# Patient Record
Sex: Male | Born: 1980 | Race: Black or African American | Hispanic: No | Marital: Single | Smoking: Former smoker
Health system: Southern US, Community
[De-identification: ages and names within clinical notes are randomized; demographics above are authoritative.]

## PROBLEM LIST (undated history)

## (undated) DIAGNOSIS — I10 Essential (primary) hypertension: Secondary | ICD-10-CM

---
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
## 2002-11-01 ENCOUNTER — Emergency Department (HOSPITAL_COMMUNITY): Admission: EM | Admit: 2002-11-01 | Discharge: 2002-11-02 | Payer: Self-pay | Admitting: Emergency Medicine

## 2004-01-05 ENCOUNTER — Emergency Department (HOSPITAL_COMMUNITY): Admission: EM | Admit: 2004-01-05 | Discharge: 2004-01-05 | Payer: Self-pay | Admitting: Emergency Medicine

## 2005-06-11 ENCOUNTER — Emergency Department (HOSPITAL_COMMUNITY): Admission: EM | Admit: 2005-06-11 | Discharge: 2005-06-12 | Payer: Self-pay | Admitting: Emergency Medicine

## 2005-07-29 ENCOUNTER — Emergency Department (HOSPITAL_COMMUNITY): Admission: EM | Admit: 2005-07-29 | Discharge: 2005-07-29 | Payer: Self-pay | Admitting: Emergency Medicine

## 2007-11-23 ENCOUNTER — Emergency Department (HOSPITAL_COMMUNITY): Admission: EM | Admit: 2007-11-23 | Discharge: 2007-11-23 | Payer: Self-pay | Admitting: Emergency Medicine

## 2010-04-17 ENCOUNTER — Emergency Department (HOSPITAL_COMMUNITY)
Admission: EM | Admit: 2010-04-17 | Discharge: 2010-04-17 | Disposition: A | Discharge status: Home / Self Care | Payer: Self-pay | Attending: Emergency Medicine | Admitting: Emergency Medicine

## 2010-04-17 DIAGNOSIS — R079 Chest pain, unspecified: Secondary | ICD-10-CM | POA: Insufficient documentation

## 2010-04-17 DIAGNOSIS — R071 Chest pain on breathing: Secondary | ICD-10-CM | POA: Insufficient documentation

## 2010-05-30 ENCOUNTER — Emergency Department (HOSPITAL_COMMUNITY): Payer: Self-pay

## 2010-05-30 ENCOUNTER — Emergency Department (HOSPITAL_COMMUNITY)
Admission: EM | Admit: 2010-05-30 | Discharge: 2010-05-30 | Disposition: A | Discharge status: Home / Self Care | Payer: Self-pay | Attending: Emergency Medicine | Admitting: Emergency Medicine

## 2010-05-30 DIAGNOSIS — R0602 Shortness of breath: Secondary | ICD-10-CM | POA: Insufficient documentation

## 2010-05-30 DIAGNOSIS — K122 Cellulitis and abscess of mouth: Secondary | ICD-10-CM | POA: Insufficient documentation

## 2010-05-30 DIAGNOSIS — R0989 Other specified symptoms and signs involving the circulatory and respiratory systems: Secondary | ICD-10-CM | POA: Insufficient documentation

## 2010-05-30 DIAGNOSIS — R0609 Other forms of dyspnea: Secondary | ICD-10-CM | POA: Insufficient documentation

## 2010-08-22 ENCOUNTER — Emergency Department (HOSPITAL_COMMUNITY)
Admission: EM | Admit: 2010-08-22 | Discharge: 2010-08-22 | Disposition: A | Discharge status: Home / Self Care | Payer: Self-pay | Attending: Emergency Medicine | Admitting: Emergency Medicine

## 2010-08-22 DIAGNOSIS — F17200 Nicotine dependence, unspecified, uncomplicated: Secondary | ICD-10-CM | POA: Insufficient documentation

## 2010-08-22 DIAGNOSIS — J029 Acute pharyngitis, unspecified: Secondary | ICD-10-CM | POA: Insufficient documentation

## 2010-08-22 LAB — RAPID STREP SCREEN (MED CTR MEBANE ONLY): Streptococcus, Group A Screen (Direct): NEGATIVE

## 2011-01-15 ENCOUNTER — Emergency Department (HOSPITAL_COMMUNITY): Payer: Self-pay

## 2011-01-15 ENCOUNTER — Emergency Department (HOSPITAL_COMMUNITY)
Admission: EM | Admit: 2011-01-15 | Discharge: 2011-01-16 | Disposition: A | Discharge status: Home / Self Care | Payer: Self-pay | Attending: Emergency Medicine | Admitting: Emergency Medicine

## 2011-01-15 ENCOUNTER — Encounter: Payer: Self-pay | Admitting: *Deleted

## 2011-01-15 ENCOUNTER — Other Ambulatory Visit: Payer: Self-pay

## 2011-01-15 DIAGNOSIS — R0789 Other chest pain: Secondary | ICD-10-CM

## 2011-01-15 DIAGNOSIS — R0602 Shortness of breath: Secondary | ICD-10-CM | POA: Insufficient documentation

## 2011-01-15 DIAGNOSIS — R071 Chest pain on breathing: Secondary | ICD-10-CM | POA: Insufficient documentation

## 2011-01-15 DIAGNOSIS — F17200 Nicotine dependence, unspecified, uncomplicated: Secondary | ICD-10-CM | POA: Insufficient documentation

## 2011-01-15 MED ORDER — IBUPROFEN 800 MG PO TABS
800.0000 mg | ORAL_TABLET | Freq: Once | ORAL | Status: AC
  Administered 2011-01-15: 800 mg via ORAL
  Filled 2011-01-15: qty 1

## 2011-01-15 NOTE — ED Notes (Signed)
Pt reports onset of left sided chest pain starting earlier today, pt reports increased pain with inspiration and palpation

## 2011-01-16 MED ORDER — IBUPROFEN 800 MG PO TABS: 800.0000 mg | ORAL_TABLET | Freq: Three times a day (TID) | ORAL | Status: AC

## 2011-01-16 NOTE — ED Provider Notes (Signed)
History     CSN: [id number] Arrival date & time: 01/15/2011 10:39 PM   First MD Initiated Contact with Patient 01/15/11 2308      Chief Complaint  Patient presents with  . Chest Pain    (Consider location/radiation/quality/duration/timing/severity/associated sxs/prior treatment) Patient is a 30 y.o. male presenting with chest pain. The history is provided by the patient.  Chest Pain The chest pain began 3 - 5 hours ago. Chest pain occurs frequently. The chest pain is unchanged. The pain is associated with breathing (Any type of movement or twisting.). The severity of the pain is moderate. The quality of the pain is described as sharp. The pain does not radiate. Chest pain is worsened by certain positions and deep breathing. Pertinent negatives for primary symptoms include no fever, no syncope, no shortness of breath, no cough, no palpitations, no abdominal pain, no nausea, no vomiting and no altered mental status.  Pertinent negatives for associated symptoms include no diaphoresis, no lower extremity edema, no near-syncope, no numbness, no orthopnea and no weakness. He tried nothing for the symptoms. Risk factors include smoking/tobacco exposure and male gender.  Pertinent negatives for past medical history include no diabetes, no hypertension, no MI and no PE.  His family medical history is significant for heart disease in family.    smoker with stabbing left-sided chest pain that is worse with movement. Patient concerned because both of his parents had heart attacks in their 59s. Patient has no lower extremity swelling or DVT symptoms. He denies any recent illness. Pain is moderate in severity, intermittent. No history of same  History reviewed. No pertinent past medical history.  History reviewed. No pertinent past surgical history.  No family history on file.  History  Substance Use Topics  . Smoking status: Current Everyday Smoker  . Smokeless tobacco: Not on file  . Alcohol  Use: No      Review of Systems  Constitutional: Negative for fever, chills and diaphoresis.  HENT: Negative for neck pain and neck stiffness.   Eyes: Negative for pain.  Respiratory: Negative for cough and shortness of breath.   Cardiovascular: Positive for chest pain. Negative for palpitations, orthopnea, syncope and near-syncope.  Gastrointestinal: Negative for nausea, vomiting and abdominal pain.  Genitourinary: Negative for dysuria.  Musculoskeletal: Negative for back pain.  Skin: Negative for rash.  Neurological: Negative for weakness, numbness and headaches.  Psychiatric/Behavioral: Negative for altered mental status.  All other systems reviewed and are negative.    Allergies  Bee venom and Penicillins  Home Medications  No current outpatient prescriptions on file.  BP 122/76  Pulse 57  Temp(Src) 97.8 F (36.6 C) (Oral)  Resp 20  Ht 6\' 1"  (1.854 m)  Wt 185 lb (83.915 kg)  BMI 24.41 kg/m2  SpO2 100%  Physical Exam  Constitutional: He is oriented to person, place, and time. He appears well-developed and well-nourished.  HENT:  Head: Normocephalic and atraumatic.  Eyes: Conjunctivae and EOM are normal. Pupils are equal, round, and reactive to light.  Neck: Trachea normal. Neck supple. No thyromegaly present.  Cardiovascular: Normal rate, regular rhythm, S1 normal, S2 normal and normal pulses.     No systolic murmur is present   No diastolic murmur is present  Pulses:      Radial pulses are 2+ on the right side, and 2+ on the left side.  Pulmonary/Chest: Effort normal and breath sounds normal. He has no wheezes. He has no rhonchi. He has no rales. He exhibits no tenderness.  Reproducible tenderness left anterior chest wall. No crepitus or rash  Abdominal: Soft. Normal appearance and bowel sounds are normal. There is no tenderness. There is no CVA tenderness and negative Murphy's sign.  Musculoskeletal:       BLE:s Calves nontender, no cords or erythema,  negative Homans sign  Neurological: He is alert and oriented to person, place, and time. He has normal strength. No cranial nerve deficit or sensory deficit. GCS eye subscore is 4. GCS verbal subscore is 5. GCS motor subscore is 6.  Skin: Skin is warm and dry. No rash noted. He is not diaphoretic.  Psychiatric: His speech is normal.       Cooperative and appropriate    ED Course  Procedures (including critical care time)  Labs Reviewed - No data to display Dg Chest 2 View  01/15/2011  *RADIOLOGY REPORT*  Clinical Data: Chest pain and short of breath  CHEST - 2 VIEW  Comparison:  05/30/2010  Findings:  The heart size and mediastinal contours are within normal limits.  Both lungs are clear.  The visualized skeletal structures are unremarkable.  IMPRESSION: No active cardiopulmonary disease.  Original Report Authenticated By: Camelia Phenes, M.D.    Date: 01/16/2011  Rate: 61  Rhythm: normal sinus rhythm  QRS Axis: normal  Intervals: normal  ST/T Wave abnormalities: nonspecific ST changes  Conduction Disutrbances:none  Narrative Interpretation:   Old EKG Reviewed: none available    Diagnosis: Chest wall pain   MDM   Otherwise healthy male with chest pain that is reproducible and pleuritic in nature. No shortness of breath or DVT/PE symptoms otherwise. Chest x-ray obtained and reviewed as above. EKG obtained and reviewed. Patient given ibuprofen for pain. He does have a primary care physician for close followup as needed. He was educated on the dangers of smoking and encouraged to stop.     Sunnie Nielsen, MD 01/16/11 [phone]

## 2011-05-16 ENCOUNTER — Emergency Department (HOSPITAL_COMMUNITY): Payer: Self-pay

## 2011-05-16 ENCOUNTER — Encounter (HOSPITAL_COMMUNITY): Payer: Self-pay | Admitting: *Deleted

## 2011-05-16 ENCOUNTER — Emergency Department (HOSPITAL_COMMUNITY)
Admission: EM | Admit: 2011-05-16 | Discharge: 2011-05-17 | Disposition: A | Discharge status: Home / Self Care | Payer: Self-pay | Attending: Emergency Medicine | Admitting: Emergency Medicine

## 2011-05-16 DIAGNOSIS — R070 Pain in throat: Secondary | ICD-10-CM | POA: Insufficient documentation

## 2011-05-16 DIAGNOSIS — R142 Eructation: Secondary | ICD-10-CM | POA: Insufficient documentation

## 2011-05-16 DIAGNOSIS — R1013 Epigastric pain: Secondary | ICD-10-CM | POA: Insufficient documentation

## 2011-05-16 DIAGNOSIS — R079 Chest pain, unspecified: Secondary | ICD-10-CM | POA: Insufficient documentation

## 2011-05-16 DIAGNOSIS — K219 Gastro-esophageal reflux disease without esophagitis: Secondary | ICD-10-CM | POA: Insufficient documentation

## 2011-05-16 DIAGNOSIS — R141 Gas pain: Secondary | ICD-10-CM | POA: Insufficient documentation

## 2011-05-16 DIAGNOSIS — J3489 Other specified disorders of nose and nasal sinuses: Secondary | ICD-10-CM | POA: Insufficient documentation

## 2011-05-16 MED ORDER — FAMOTIDINE 20 MG PO TABS
20.0000 mg | ORAL_TABLET | Freq: Once | ORAL | Status: AC
  Administered 2011-05-17: 20 mg via ORAL
  Filled 2011-05-16: qty 1

## 2011-05-16 NOTE — ED Provider Notes (Signed)
History    Scribed for Shelda Jakes, MD, the patient was seen in room APA06/APA06. This chart was scribed by Katha Cabal.  Pt was seen at 11:19 PM    CSN: [id number]  Arrival date & time 05/16/11  2202   First MD Initiated Contact with Patient 05/16/11 2311      Chief Complaint  Patient presents with  . Gastrophageal Reflux    (Consider location/radiation/quality/duration/timing/severity/associated sxs/prior treatment) HPI  Ryan Rios is a 31 y.o. male who presents to the Emergency Department complaining of  gastrophageal reflux with  epigastric abdominal pain and lower left chest pain.  Pain is has been constant and is worse in lower left chest.  Pain does not radiate to back.  Pain is described as sharp.  Symptoms are associated with belching and sore throat.  Patient was seen at [facility] for chest pain and was given Motrin.  Patient states he did not take motrin.  Patient took Tums and baking soda.  Symptoms are not associated with nausea, vomiting, diarrhea.  Patient states he wanted to vomit but could not.  Pain is rated 1 or 2 out of 10 currently and 5/10 at worse.  Patient reports some rhinorrhea.   Patient denies rash, swelling in legs, dysuria, fever, congestion, neck pain, cough or history of bleeding.  There is no history of chronic health problems.    PCP Vivia Ewing, MD, MD     History reviewed. No pertinent past medical history.  History reviewed. No pertinent past surgical history.  History reviewed. No pertinent family history.  History  Substance Use Topics  . Smoking status: Former Games developer  . Smokeless tobacco: Not on file  . Alcohol Use: No      Review of Systems  All other systems reviewed and are negative.  Remaining review of systems negative except as noted in the HPI.    Allergies  Bee venom and Penicillins  Home Medications  No current outpatient prescriptions on file.  BP 119/78  Pulse 83  Temp(Src) 97.6 F (36.4  C) (Oral)  Resp 18  Ht 6\' 1"  (1.854 m)  Wt 175 lb (79.379 kg)  BMI 23.09 kg/m2  SpO2 97%  Physical Exam  Nursing note and vitals reviewed. Constitutional: He is oriented to person, place, and time. He appears well-developed and well-nourished.  HENT:  Head: Normocephalic and atraumatic.  Eyes: Conjunctivae and EOM are normal.  Neck: Neck supple.  Cardiovascular: Normal rate, regular rhythm and normal heart sounds.   No murmur heard. Pulmonary/Chest: Effort normal and breath sounds normal. No respiratory distress. He has no wheezes.  Abdominal: Soft. Bowel sounds are normal. There is no tenderness. There is no rebound and no guarding.  Musculoskeletal: Normal range of motion. He exhibits no edema.  Lymphadenopathy:    He has no cervical adenopathy.  Neurological: He is alert and oriented to person, place, and time. No cranial nerve deficit or sensory deficit. Coordination normal.  Skin: Skin is warm and dry. No rash noted.  Psychiatric: He has a normal mood and affect. His behavior is normal.    ED Course  Procedures (including critical care time)   DIAGNOSTIC STUDIES: Oxygen Saturation is 97% on room air, normal by my interpretation.     COORDINATION OF CARE: 11:21 PM Physical exam complete.  Will order CXR and labs.  11:45 PM  Pepcid ordered.      LABS / RADIOLOGY:    Labs Reviewed  COMPREHENSIVE METABOLIC PANEL  LIPASE, BLOOD  CBC   No results found.    Date: 05/17/2011  Rate: 50  Rhythm: sinus bradycardia and sinus arrhythmia  QRS Axis: normal  Intervals: normal  ST/T Wave abnormalities: normal  Conduction Disutrbances:none  Narrative Interpretation:   Old EKG Reviewed: unchanged No change in EKG from 01/15/2011  Results for orders placed during the hospital encounter of 05/16/11  COMPREHENSIVE METABOLIC PANEL      Component Value Range   Sodium 137  135 - 145 (mEq/L)   Potassium 3.8  3.5 - 5.1 (mEq/L)   Chloride 103  96 - 112 (mEq/L)   CO2 28   19 - 32 (mEq/L)   Glucose, Bld 102 (*) 70 - 99 (mg/dL)   BUN 16  6 - 23 (mg/dL)   Creatinine, Ser 4.54  0.50 - 1.35 (mg/dL)   Calcium 9.6  8.4 - 09.8 (mg/dL)   Total Protein 7.0  6.0 - 8.3 (g/dL)   Albumin 3.6  3.5 - 5.2 (g/dL)   AST 35  0 - 37 (U/L)   ALT 62 (*) 0 - 53 (U/L)   Alkaline Phosphatase 75  39 - 117 (U/L)   Total Bilirubin 0.3  0.3 - 1.2 (mg/dL)   GFR calc non Af Amer >90  >90 (mL/min)   GFR calc Af Amer >90  >90 (mL/min)  LIPASE, BLOOD      Component Value Range   Lipase 35  11 - 59 (U/L)  CBC      Component Value Range   WBC 6.0  4.0 - 10.5 (K/uL)   RBC 4.42  4.22 - 5.81 (MIL/uL)   Hemoglobin 13.1  13.0 - 17.0 (g/dL)   HCT 11.9 (*) 14.7 - 52.0 (%)   MCV 85.1  78.0 - 100.0 (fL)   MCH 29.6  26.0 - 34.0 (pg)   MCHC 34.8  30.0 - 36.0 (g/dL)   RDW 82.9  56.2 - 13.0 (%)   Platelets 158  150 - 400 (K/uL)      MDM  Clinically symptoms sound like GERD workup here tonight without a leukocytosis no significant liver function test abnormalities biliary colic presence of gallstones still a possibility but most likely reflux disease. Treat with Pepcid in the emergency room we'll continue Pepcid at home for the next 2 weeks does have primary care followup available and will need to followup in the next few days. Patient is nontoxic in no acute distress.           MEDICATIONS GIVEN IN THE E.D. Scheduled Meds:    . famotidine  20 mg Oral Once   Continuous Infusions:      IMPRESSION: No diagnosis found.   NEW MEDICATIONS: New Prescriptions   No medications on file     I personally performed the services described in this documentation, which was scribed in my presence. The recorded information has been reviewed and considered.        Shelda Jakes, MD 05/17/11 [phone]

## 2011-05-16 NOTE — ED Notes (Signed)
C/o indigestion x 1 week, denies hx, "hard to belch" per pt

## 2011-05-17 ENCOUNTER — Other Ambulatory Visit: Payer: Self-pay

## 2011-05-17 LAB — CBC
HCT: 37.6 % — ABNORMAL LOW (ref 39.0–52.0)
Hemoglobin: 13.1 g/dL (ref 13.0–17.0)
MCH: 29.6 pg (ref 26.0–34.0)
MCHC: 34.8 g/dL (ref 30.0–36.0)
MCV: 85.1 fL (ref 78.0–100.0)
Platelets: 158 10*3/uL (ref 150–400)
RBC: 4.42 MIL/uL (ref 4.22–5.81)
RDW: 12.4 % (ref 11.5–15.5)
WBC: 6 10*3/uL (ref 4.0–10.5)

## 2011-05-17 LAB — COMPREHENSIVE METABOLIC PANEL
ALT: 62 U/L — ABNORMAL HIGH (ref 0–53)
AST: 35 U/L (ref 0–37)
Albumin: 3.6 g/dL (ref 3.5–5.2)
Alkaline Phosphatase: 75 U/L (ref 39–117)
BUN: 16 mg/dL (ref 6–23)
CO2: 28 mEq/L (ref 19–32)
Calcium: 9.6 mg/dL (ref 8.4–10.5)
Chloride: 103 mEq/L (ref 96–112)
Creatinine, Ser: 1.07 mg/dL (ref 0.50–1.35)
GFR calc Af Amer: >90 mL/min (ref >90)
GFR calc non Af Amer: >90 mL/min (ref >90)
Glucose, Bld: 102 mg/dL — ABNORMAL HIGH (ref 70–99)
Potassium: 3.8 mEq/L (ref 3.5–5.1)
Sodium: 137 mEq/L (ref 135–145)
Total Bilirubin: 0.3 mg/dL (ref 0.3–1.2)
Total Protein: 7 g/dL (ref 6.0–8.3)

## 2011-05-17 LAB — LIPASE, BLOOD: Lipase: 35 U/L (ref 11–59)

## 2011-05-17 MED ORDER — FAMOTIDINE 20 MG PO TABS: 20.0000 mg | ORAL_TABLET | Freq: Two times a day (BID) | ORAL | Status: DC

## 2011-05-17 NOTE — ED Notes (Signed)
Pt reports indigestion for 1 week.  Denies vomiting.  No distress noted.

## 2012-03-02 IMAGING — CR DG CHEST 2V
2 series · 2 of 2 positions shown · non-contrast
Comparison: 07/29/2005

CLINICAL DATA: Chest pressure.  Shortness of breath.

CHEST - 1 VIEW

[view not recorded (1 of 2)]
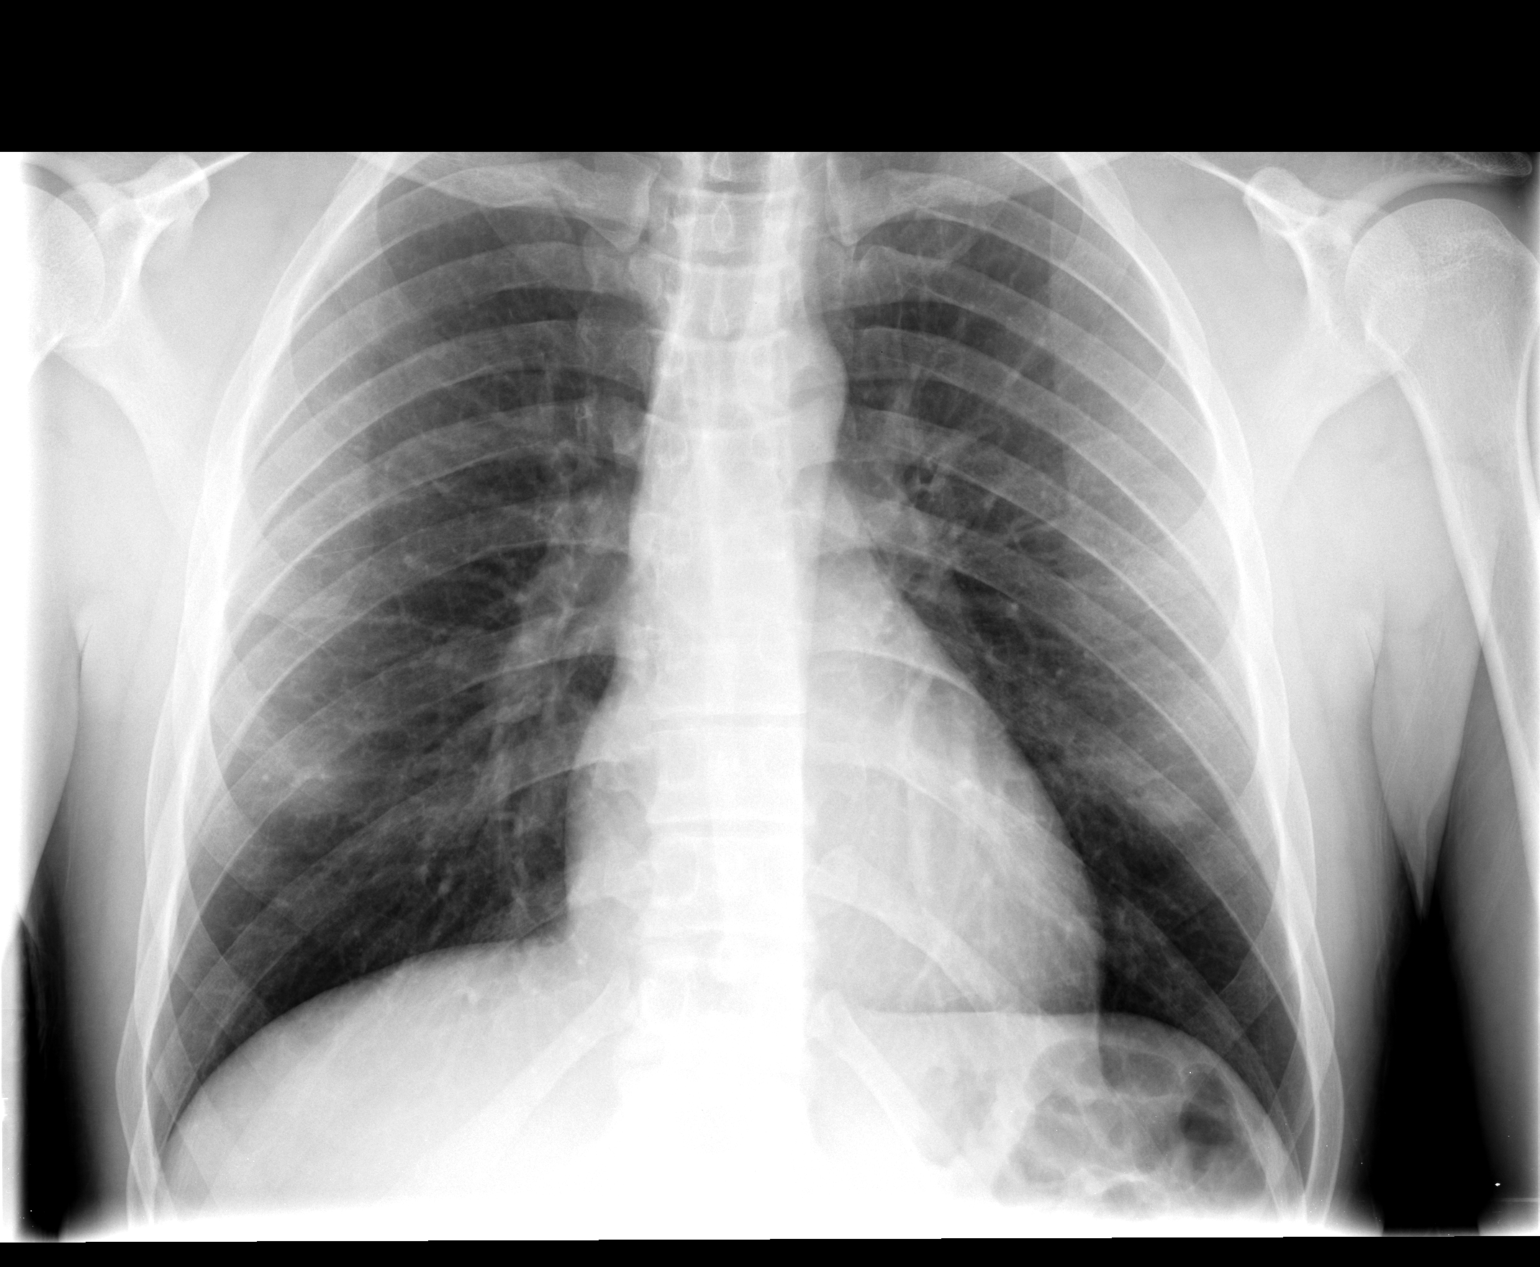

[view not recorded (2 of 2)]
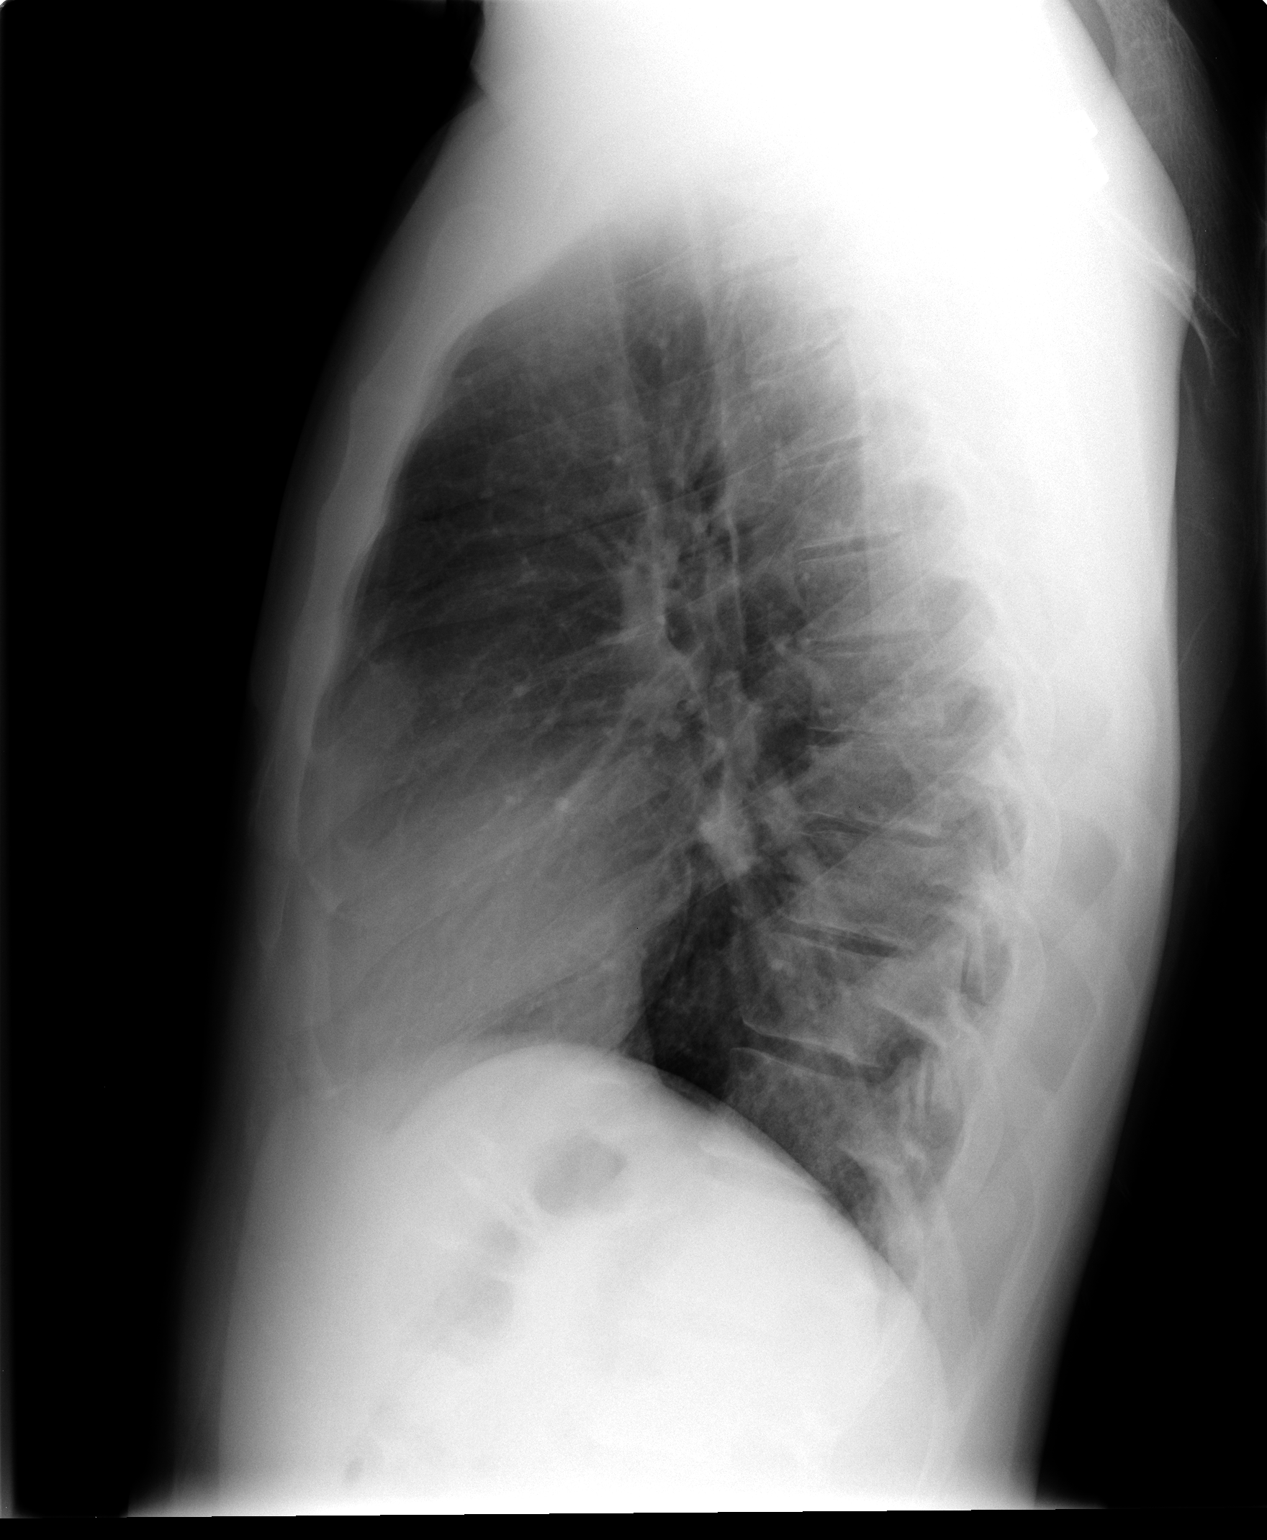

[2 of 2 positions shown; findings below may reference images not displayed]

FINDINGS: The heart size and mediastinal contours are within normal
limits.  Both lungs are clear.
IMPRESSION: No active disease.

## 2012-04-15 ENCOUNTER — Encounter (HOSPITAL_COMMUNITY): Payer: Self-pay | Admitting: *Deleted

## 2012-04-15 ENCOUNTER — Emergency Department (HOSPITAL_COMMUNITY)
Admission: EM | Admit: 2012-04-15 | Discharge: 2012-04-16 | Disposition: A | Discharge status: Home / Self Care | Payer: Self-pay | Attending: Emergency Medicine | Admitting: Emergency Medicine

## 2012-04-15 DIAGNOSIS — Z87891 Personal history of nicotine dependence: Secondary | ICD-10-CM | POA: Insufficient documentation

## 2012-04-15 DIAGNOSIS — R110 Nausea: Secondary | ICD-10-CM | POA: Insufficient documentation

## 2012-04-15 DIAGNOSIS — R51 Headache: Secondary | ICD-10-CM | POA: Insufficient documentation

## 2012-04-15 DIAGNOSIS — G44209 Tension-type headache, unspecified, not intractable: Secondary | ICD-10-CM

## 2012-04-15 NOTE — ED Provider Notes (Signed)
History     CSN: [id number]  Arrival date & time 04/15/12  2246   First MD Initiated Contact with Patient 04/15/12 2310      Chief Complaint  Patient presents with  . Headache    (Consider location/radiation/quality/duration/timing/severity/associated sxs/prior treatment) Patient is a 32 y.o. male presenting with headaches. The history is provided by the patient.  Headache  This is a new problem. The current episode started yesterday. The problem has not changed since onset.The headache is associated with nothing. The pain is located in the left unilateral and temporal region. The quality of the pain is described as dull. The pain is moderate. The pain does not radiate. Associated symptoms include nausea. Pertinent negatives include no anorexia, no fever, no palpitations, no syncope, no shortness of breath and no vomiting. He has tried NSAIDs for the symptoms. The treatment provided no relief.    History reviewed. No pertinent past medical history.  History reviewed. No pertinent past surgical history.  History reviewed. No pertinent family history.  History  Substance Use Topics  . Smoking status: Former Games developer  . Smokeless tobacco: Not on file  . Alcohol Use: No      Review of Systems  Constitutional: Negative for fever and activity change.       All ROS Neg except as noted in HPI  HENT: Negative for nosebleeds and neck pain.   Eyes: Negative for photophobia and discharge.  Respiratory: Negative for cough, shortness of breath and wheezing.   Cardiovascular: Negative for chest pain, palpitations and syncope.  Gastrointestinal: Positive for nausea. Negative for vomiting, abdominal pain, blood in stool and anorexia.  Genitourinary: Negative for dysuria, frequency and hematuria.  Musculoskeletal: Negative for back pain and arthralgias.  Skin: Negative.   Neurological: Positive for headaches. Negative for dizziness, seizures and speech difficulty.  Psychiatric/Behavioral:  Negative for hallucinations and confusion.    Allergies  Bee venom and Penicillins  Home Medications   Current Outpatient Rx  Name  Route  Sig  Dispense  Refill  . FAMOTIDINE 20 MG PO TABS   Oral   Take 1 tablet (20 mg total) by mouth 2 (two) times daily.   30 tablet   0     BP 136/92  Pulse 77  Temp 98.4 F (36.9 C) (Oral)  Resp 20  Ht 6\' 1"  (1.854 m)  Wt 200 lb (90.719 kg)  BMI 26.39 kg/m2  SpO2 100%  Physical Exam  Nursing note and vitals reviewed. Constitutional: He is oriented to person, place, and time. He appears well-developed and well-nourished.  Non-toxic appearance.  HENT:  Head: Normocephalic.  Right Ear: Tympanic membrane and external ear normal.  Left Ear: Tympanic membrane and external ear normal.  Eyes: EOM and lids are normal. Pupils are equal, round, and reactive to light.  Neck: Normal range of motion. Neck supple. Carotid bruit is not present.       Muscles to the left side of the neck and shoulders tight and tense.   Cardiovascular: Normal rate, regular rhythm, normal heart sounds, intact distal pulses and normal pulses.   Pulmonary/Chest: Breath sounds normal. No respiratory distress.  Abdominal: Soft. Bowel sounds are normal. There is no tenderness. There is no guarding.  Musculoskeletal: Normal range of motion.       Muscle tightness in the left upper trapezius.  Lymphadenopathy:       Head (right side): No submandibular adenopathy present.       Head (left side): No submandibular adenopathy present.  He has no cervical adenopathy.  Neurological: He is alert and oriented to person, place, and time. He has normal strength. No cranial nerve deficit or sensory deficit. He exhibits normal muscle tone. Coordination normal.  Skin: Skin is warm and dry.  Psychiatric: He has a normal mood and affect. His speech is normal.    ED Course  Procedures (including critical care time)  Labs Reviewed - No data to display No results found. PUlse Ox  100% on RA.WNL by my interpretation.  No diagnosis found.    MDM  I have reviewed nursing notes, vital signs, and all appropriate lab and imaging results for this patient. Pt presents to ED with headache that is described as a pinching sensation at the top of the left scalp, and a tightness of the neck and shoulder and temporal area on the left. No history of injury or trauma. Exam is negative for acute changes or problem. Plan - Rx for robaxin and norco for muscle contraction headache. Pt to return if not improving. No thunder clap headache, or "worse headache of his life". No visual changes. No LOC. No change in gait, speech, or facial symmetry. Feel it is safe for pt to be discharged home.       Kathie Dike, [place] 04/16/12 [phone]

## 2012-04-15 NOTE — ED Notes (Signed)
Onset yesterday with "pinching sensation to top of my head",  Cold , numb sensation to back of head.  No known injury. Alert,

## 2012-04-16 MED ORDER — METHOCARBAMOL 500 MG PO TABS
1000.0000 mg | ORAL_TABLET | Freq: Once | ORAL | Status: AC
  Administered 2012-04-16: 1000 mg via ORAL
  Filled 2012-04-16: qty 2

## 2012-04-16 MED ORDER — HYDROCODONE-ACETAMINOPHEN 5-325 MG PO TABS: ORAL_TABLET | ORAL | Status: DC

## 2012-04-16 MED ORDER — PROMETHAZINE HCL 12.5 MG PO TABS
12.5000 mg | ORAL_TABLET | Freq: Once | ORAL | Status: AC
  Administered 2012-04-16: 12.5 mg via ORAL
  Filled 2012-04-16: qty 1

## 2012-04-16 MED ORDER — METHOCARBAMOL 500 MG PO TABS: 500.0000 mg | ORAL_TABLET | Freq: Three times a day (TID) | ORAL | Status: DC

## 2012-04-16 MED ORDER — HYDROCODONE-ACETAMINOPHEN 5-325 MG PO TABS
2.0000 | ORAL_TABLET | Freq: Once | ORAL | Status: AC
  Administered 2012-04-16: 2 via ORAL
  Filled 2012-04-16: qty 2

## 2012-04-16 NOTE — ED Provider Notes (Signed)
Medical screening examination/treatment/procedure(s) were conducted as a shared visit with non-physician practitioner(s) and myself.  I personally evaluated the patient during the encounter  Nicoletta Dress. Colon Branch, MD 04/16/12 [phone]

## 2012-06-04 ENCOUNTER — Other Ambulatory Visit: Payer: Self-pay

## 2012-06-04 ENCOUNTER — Encounter (HOSPITAL_COMMUNITY): Payer: Self-pay | Admitting: *Deleted

## 2012-06-04 ENCOUNTER — Emergency Department (HOSPITAL_COMMUNITY)
Admission: EM | Admit: 2012-06-04 | Discharge: 2012-06-05 | Disposition: A | Discharge status: Home / Self Care | Payer: Self-pay | Attending: Emergency Medicine | Admitting: Emergency Medicine

## 2012-06-04 DIAGNOSIS — Z87891 Personal history of nicotine dependence: Secondary | ICD-10-CM | POA: Insufficient documentation

## 2012-06-04 DIAGNOSIS — I10 Essential (primary) hypertension: Secondary | ICD-10-CM | POA: Insufficient documentation

## 2012-06-04 DIAGNOSIS — R002 Palpitations: Secondary | ICD-10-CM | POA: Insufficient documentation

## 2012-06-04 DIAGNOSIS — R0789 Other chest pain: Secondary | ICD-10-CM | POA: Insufficient documentation

## 2012-06-04 LAB — POCT I-STAT, CHEM 8
BUN: 17 mg/dL (ref 6–23)
Calcium, Ion: 1.2 mmol/L (ref 1.12–1.23)
Chloride: 103 mEq/L (ref 96–112)
Creatinine, Ser: 1.2 mg/dL (ref 0.50–1.35)
Glucose, Bld: 106 mg/dL — ABNORMAL HIGH (ref 70–99)
HCT: 41 % (ref 39.0–52.0)
Hemoglobin: 13.9 g/dL (ref 13.0–17.0)
Potassium: 4.1 mEq/L (ref 3.5–5.1)
Sodium: 138 mEq/L (ref 135–145)
TCO2: 27 mmol/L (ref 0–100)

## 2012-06-04 MED ORDER — ATENOLOL 25 MG PO TABS: 25.0000 mg | ORAL_TABLET | Freq: Every day | ORAL | Status: DC

## 2012-06-04 NOTE — ED Notes (Addendum)
Pt states he "feels weird" like his heart is fluttering and he is drowsy. Pt has been worrying about his blood pressure lately. Pt also c/o aches and pains in his left leg, left arm and the top of his head.

## 2012-06-04 NOTE — ED Provider Notes (Signed)
History    This chart was scribed for Ryan Nielsen, MD, by Frederik Pear, ED scribe. The patient was seen in room APA10/APA10 and the patient's care was started at 2302.    CSN: [id number]  Arrival date & time 06/04/12  2245   First MD Initiated Contact with Patient 06/04/12 2302      Chief Complaint  Patient presents with  . Tachycardia    (Consider location/radiation/quality/duration/timing/severity/associated sxs/prior treatment) Patient is a 32 y.o. male presenting with general illness. The history is provided by the patient. No language interpreter was used.  Illness  The current episode started today. The onset was sudden. The problem occurs continuously. The problem has been rapidly improving. The problem is mild. Nothing relieves the symptoms. Nothing aggravates the symptoms.   PHYLLIP CLAW is a 32 y.o. male who presents to the Emergency Department complaining of sudden onset, rapidly improving hypertension that was first noticed when he took his BP with a cuff at [facility], which read 165/95. He also complains of an intermittent, fluttering sensation in his chest that lasts for approximately an hour before it resolves on its own and occurs 2-3 times a day. He also complains of a pinching sensation to his scalp and extremities that comes and goes. He reports that he had a viral infection 2 weeks ago that has since resolved. He denies any h/o of thyroid disorders. He reports that he takes prevacid daily, but denies any other h/o of chronic medical conditions. He is allergic to penicillin.   No PCP.  History reviewed. No pertinent past medical history.  History reviewed. No pertinent past surgical history.  History reviewed. No pertinent family history.  History  Substance Use Topics  . Smoking status: Former Games developer  . Smokeless tobacco: Not on file  . Alcohol Use: No      Review of Systems  Respiratory: Positive for chest tightness (fluttering).   All other systems  reviewed and are negative.   Allergies  Bee venom and Penicillins  Home Medications   Current Outpatient Rx  Name  Route  Sig  Dispense  Refill  . EXPIRED: famotidine (PEPCID) 20 MG tablet   Oral   Take 1 tablet (20 mg total) by mouth 2 (two) times daily.   30 tablet   0   . HYDROcodone-acetaminophen (NORCO/VICODIN) 5-325 MG per tablet      1 or 2 po q4h prn pain   16 tablet   0   . methocarbamol (ROBAXIN) 500 MG tablet   Oral   Take 1 tablet (500 mg total) by mouth 3 (three) times daily.   21 tablet   0     BP 147/94  Pulse 66  Temp(Src) 97.9 F (36.6 C) (Oral)  Resp 24  Ht 6' (1.829 m)  Wt 181 lb (82.101 kg)  BMI 24.54 kg/m2  SpO2 100%  Physical Exam  Nursing note and vitals reviewed. Constitutional: He is oriented to person, place, and time. He appears well-developed and well-nourished. No distress.  HENT:  Head: Normocephalic and atraumatic.  Eyes: EOM are normal.  Neck: Normal range of motion. Neck supple. No tracheal deviation present.  Cardiovascular: Normal rate, regular rhythm and normal heart sounds.   No murmur heard. Negative homans sign.  Pulmonary/Chest: Effort normal and breath sounds normal. No respiratory distress.  Abdominal: There is no tenderness.  Musculoskeletal: Normal range of motion. He exhibits no edema and no tenderness.  No peripheral edema. No evidence of cords.  Neurological: He is  alert and oriented to person, place, and time.  Skin: Skin is warm and dry.  Psychiatric: He has a normal mood and affect. His behavior is normal.    ED Course  Procedures (including critical care time)  DIAGNOSTIC STUDIES: Oxygen Saturation is 100% on room air, normal by my interpretation.    COORDINATION OF CARE:  23:19- Discussed planned course of treatment with the patient, including an EKG and I-Stat, who is agreeable at this time.  Results for orders placed during the hospital encounter of 06/04/12  POCT I-STAT, CHEM 8      Result  Value Range   Sodium 138  135 - 145 mEq/L   Potassium 4.1  3.5 - 5.1 mEq/L   Chloride 103  96 - 112 mEq/L   BUN 17  6 - 23 mg/dL   Creatinine, Ser 8.29  0.50 - 1.35 mg/dL   Glucose, Bld 562 (*) 70 - 99 mg/dL   Calcium, Ion 1.30  1.12 - 1.23 mmol/L   TCO2 27  0 - 100 mmol/L   Hemoglobin 13.9  13.0 - 17.0 g/dL   HCT 86.5  78.4 - 69.6 %    Date: 06/04/2012  Rate: 64  Rhythm: normal sinus rhythm  QRS Axis: normal  Intervals: normal  ST/T Wave abnormalities: nonspecific ST changes  Conduction Disutrbances:none  Narrative Interpretation:   Old EKG Reviewed: unchanged   MDM  Elevated BP at [facility] now borderline elevation. Pt also with fluttering intermittently for weeks. Plan PCP follow up recheck BP and consider Holter monitoring. No indication for admit of further work up at this time in the ED - suspect element of anxiety.   Labs, screening ECG.  VS and nursing notes reviewed. PT agrees to return precautions.   I personally performed the services described in this documentation, which was scribed in my presence. The recorded information has been reviewed and is accurate.        Ryan Nielsen, MD 06/04/12 [phone]

## 2012-06-05 MED ORDER — ATENOLOL 25 MG PO TABS: 12.5000 mg | ORAL_TABLET | Freq: Every day | ORAL | Status: DC

## 2012-07-22 ENCOUNTER — Encounter (HOSPITAL_COMMUNITY): Payer: Self-pay | Admitting: *Deleted

## 2012-07-22 ENCOUNTER — Emergency Department (HOSPITAL_COMMUNITY)
Admission: EM | Admit: 2012-07-22 | Discharge: 2012-07-22 | Disposition: A | Discharge status: Home / Self Care | Payer: Self-pay | Attending: Emergency Medicine | Admitting: Emergency Medicine

## 2012-07-22 DIAGNOSIS — Y9389 Activity, other specified: Secondary | ICD-10-CM | POA: Insufficient documentation

## 2012-07-22 DIAGNOSIS — X500XXA Overexertion from strenuous movement or load, initial encounter: Secondary | ICD-10-CM | POA: Insufficient documentation

## 2012-07-22 DIAGNOSIS — S199XXA Unspecified injury of neck, initial encounter: Secondary | ICD-10-CM | POA: Insufficient documentation

## 2012-07-22 DIAGNOSIS — E119 Type 2 diabetes mellitus without complications: Secondary | ICD-10-CM | POA: Insufficient documentation

## 2012-07-22 DIAGNOSIS — S43499A Other sprain of unspecified shoulder joint, initial encounter: Secondary | ICD-10-CM | POA: Insufficient documentation

## 2012-07-22 DIAGNOSIS — Z79899 Other long term (current) drug therapy: Secondary | ICD-10-CM | POA: Insufficient documentation

## 2012-07-22 DIAGNOSIS — Z880 Allergy status to penicillin: Secondary | ICD-10-CM | POA: Insufficient documentation

## 2012-07-22 DIAGNOSIS — I10 Essential (primary) hypertension: Secondary | ICD-10-CM | POA: Insufficient documentation

## 2012-07-22 DIAGNOSIS — Y929 Unspecified place or not applicable: Secondary | ICD-10-CM | POA: Insufficient documentation

## 2012-07-22 DIAGNOSIS — S0993XA Unspecified injury of face, initial encounter: Secondary | ICD-10-CM | POA: Insufficient documentation

## 2012-07-22 DIAGNOSIS — Z87891 Personal history of nicotine dependence: Secondary | ICD-10-CM | POA: Insufficient documentation

## 2012-07-22 DIAGNOSIS — S46812A Strain of other muscles, fascia and tendons at shoulder and upper arm level, left arm, initial encounter: Secondary | ICD-10-CM

## 2012-07-22 HISTORY — DX: Essential (primary) hypertension: I10

## 2012-07-22 MED ORDER — HYDROCODONE-ACETAMINOPHEN 5-325 MG PO TABS: 1.0000 | ORAL_TABLET | ORAL | Status: DC | PRN

## 2012-07-22 MED ORDER — KETOROLAC TROMETHAMINE 60 MG/2ML IM SOLN
60.0000 mg | Freq: Once | INTRAMUSCULAR | Status: AC
  Administered 2012-07-22: 60 mg via INTRAMUSCULAR
  Filled 2012-07-22: qty 2

## 2012-07-22 MED ORDER — HYDROCODONE-ACETAMINOPHEN 5-325 MG PO TABS
1.0000 | ORAL_TABLET | Freq: Once | ORAL | Status: AC
  Administered 2012-07-22: 1 via ORAL
  Filled 2012-07-22: qty 1

## 2012-07-22 MED ORDER — MELOXICAM 7.5 MG PO TABS: ORAL_TABLET | ORAL | Status: DC

## 2012-07-22 MED ORDER — BACLOFEN 10 MG PO TABS: 10.0000 mg | ORAL_TABLET | Freq: Three times a day (TID) | ORAL | Status: AC

## 2012-07-22 MED ORDER — DIAZEPAM 5 MG PO TABS
5.0000 mg | ORAL_TABLET | Freq: Once | ORAL | Status: AC
  Administered 2012-07-22: 5 mg via ORAL
  Filled 2012-07-22: qty 1

## 2012-07-22 NOTE — ED Notes (Signed)
Pt complains of left neck and left shoulder pain x3 days, states on Tuesday he went to lift up some weights and felt a pop in his L shoulder, states pain shoots from back to front of chest. Hurts worse when lying down. No other complaints.

## 2012-07-22 NOTE — ED Provider Notes (Signed)
History     CSN: [id number]  Arrival date & time 07/22/12  2136   First MD Initiated Contact with Patient 07/22/12 2209      Chief Complaint  Patient presents with  . Neck Pain    (Consider location/radiation/quality/duration/timing/severity/associated sxs/prior treatment) Patient is a 32 y.o. male presenting with neck pain. The history is provided by the patient.  Neck Pain Pain location:  L side Quality:  Aching and cramping Pain radiates to:  L shoulder Pain severity:  Moderate Pain is:  Same all the time Onset quality:  Sudden Timing:  Constant Progression:  Worsening Chronicity:  New Context: lifting a heavy object   Relieved by:  Nothing Exacerbated by: movement of the left shoulder. Ineffective treatments:  None tried Associated symptoms: no bladder incontinence, no bowel incontinence, no chest pain, no numbness and no photophobia   Risk factors: no hx of spinal trauma, no recent head injury and no recurrent falls     Past Medical History  Diagnosis Date  . Diabetes mellitus without complication   . Hypertension     History reviewed. No pertinent past surgical history.  History reviewed. No pertinent family history.  History  Substance Use Topics  . Smoking status: Former Games developer  . Smokeless tobacco: Not on file  . Alcohol Use: No      Review of Systems  Constitutional: Negative for activity change.       All ROS Neg except as noted in HPI  HENT: Positive for neck pain. Negative for nosebleeds.   Eyes: Negative for photophobia and discharge.  Respiratory: Negative for cough, shortness of breath and wheezing.   Cardiovascular: Negative for chest pain and palpitations.  Gastrointestinal: Negative for abdominal pain, blood in stool and bowel incontinence.  Genitourinary: Negative for bladder incontinence, dysuria, frequency and hematuria.  Musculoskeletal: Negative for back pain and arthralgias.  Skin: Negative.   Neurological: Negative for  dizziness, seizures, speech difficulty and numbness.  Psychiatric/Behavioral: Negative for hallucinations and confusion.    Allergies  Bee venom and Penicillins  Home Medications   Current Outpatient Rx  Name  Route  Sig  Dispense  Refill  . atenolol (TENORMIN) 25 MG tablet   Oral   Take 0.5 tablets (12.5 mg total) by mouth daily.   14 tablet   0   . baclofen (LIORESAL) 10 MG tablet   Oral   Take 1 tablet (10 mg total) by mouth 3 (three) times daily.   21 each   0   . HYDROcodone-acetaminophen (NORCO/VICODIN) 5-325 MG per tablet   Oral   Take 1 tablet by mouth every 4 (four) hours as needed for pain.   20 tablet   0   . meloxicam (MOBIC) 7.5 MG tablet      1 po bid with food   14 tablet   0     BP 121/85  Pulse 57  Temp(Src) 97 F (36.1 C) (Oral)  Resp 20  Ht 6\' 1"  (1.854 m)  Wt 180 lb (81.647 kg)  BMI 23.75 kg/m2  SpO2 100%  Physical Exam  Nursing note and vitals reviewed. Constitutional: He is oriented to person, place, and time. He appears well-developed and well-nourished.  Non-toxic appearance.  HENT:  Head: Normocephalic.  Right Ear: Tympanic membrane and external ear normal.  Left Ear: Tympanic membrane and external ear normal.  Eyes: EOM and lids are normal. Pupils are equal, round, and reactive to light.  Neck: Normal range of motion. Neck supple. Carotid bruit is  not present.  Cardiovascular: Normal rate, regular rhythm, normal heart sounds, intact distal pulses and normal pulses.   Pulmonary/Chest: Breath sounds normal. No respiratory distress.  Abdominal: Soft. Bowel sounds are normal. There is no tenderness. There is no guarding.  Musculoskeletal: Normal range of motion.  There is pain of the left lower neck extending into the left shoulder. The soreness is in the distribution of the trapezius muscle on the left. There is full range of motion of the left shoulder, but with some discomfort. There is full range of motion of the left elbow wrist  and fingers.  Lymphadenopathy:       Head (right side): No submandibular adenopathy present.       Head (left side): No submandibular adenopathy present.    He has no cervical adenopathy.  Neurological: He is alert and oriented to person, place, and time. He has normal strength. No cranial nerve deficit or sensory deficit.  Skin: Skin is warm and dry.  Psychiatric: He has a normal mood and affect. His speech is normal.    ED Course  Procedures (including critical care time)  Labs Reviewed - No data to display No results found.   1. Trapezius muscle strain, left, initial encounter       MDM  I have reviewed nursing notes, vital signs, and all appropriate lab and imaging results for this patient. Patient states he was lifting weights when he felt something pull in the back of his neck extending into the shoulder on the left side. He's been having pain since that time. Examination is negative for any neurovascular deficits. Suspect patient has a trapezius strain. Patient is treated with baclofen, Mobic, and Norco. Patient is to see the orthopedic physician for additional evaluation if not improving.       Kathie Dike, PA-C 07/22/12 2304

## 2012-07-22 NOTE — ED Notes (Signed)
Pt was lifting heavy wt, and  Had a sudden  Onset of pain in neck and lt shoulder.

## 2012-07-25 NOTE — ED Provider Notes (Signed)
Medical screening examination/treatment/procedure(s) were performed by non-physician practitioner and as supervising physician I was immediately available for consultation/collaboration.   Montey Ebel W. Shaylon Gillean, MD 07/25/12 0510 

## 2012-08-21 ENCOUNTER — Emergency Department (HOSPITAL_COMMUNITY)
Admission: EM | Admit: 2012-08-21 | Discharge: 2012-08-21 | Disposition: A | Discharge status: Home / Self Care | Payer: Self-pay | Attending: Emergency Medicine | Admitting: Emergency Medicine

## 2012-08-21 ENCOUNTER — Encounter (HOSPITAL_COMMUNITY): Payer: Self-pay | Admitting: *Deleted

## 2012-08-21 DIAGNOSIS — Z79899 Other long term (current) drug therapy: Secondary | ICD-10-CM | POA: Insufficient documentation

## 2012-08-21 DIAGNOSIS — I10 Essential (primary) hypertension: Secondary | ICD-10-CM | POA: Insufficient documentation

## 2012-08-21 DIAGNOSIS — M542 Cervicalgia: Secondary | ICD-10-CM

## 2012-08-21 DIAGNOSIS — S46909A Unspecified injury of unspecified muscle, fascia and tendon at shoulder and upper arm level, unspecified arm, initial encounter: Secondary | ICD-10-CM | POA: Insufficient documentation

## 2012-08-21 DIAGNOSIS — X503XXA Overexertion from repetitive movements, initial encounter: Secondary | ICD-10-CM | POA: Insufficient documentation

## 2012-08-21 DIAGNOSIS — Z880 Allergy status to penicillin: Secondary | ICD-10-CM | POA: Insufficient documentation

## 2012-08-21 DIAGNOSIS — S0993XA Unspecified injury of face, initial encounter: Secondary | ICD-10-CM | POA: Insufficient documentation

## 2012-08-21 DIAGNOSIS — S4980XA Other specified injuries of shoulder and upper arm, unspecified arm, initial encounter: Secondary | ICD-10-CM | POA: Insufficient documentation

## 2012-08-21 DIAGNOSIS — Y929 Unspecified place or not applicable: Secondary | ICD-10-CM | POA: Insufficient documentation

## 2012-08-21 DIAGNOSIS — Z87891 Personal history of nicotine dependence: Secondary | ICD-10-CM | POA: Insufficient documentation

## 2012-08-21 DIAGNOSIS — Y939 Activity, unspecified: Secondary | ICD-10-CM | POA: Insufficient documentation

## 2012-08-21 MED ORDER — IBUPROFEN 800 MG PO TABS
800.0000 mg | ORAL_TABLET | Freq: Once | ORAL | Status: AC
  Administered 2012-08-21: 800 mg via ORAL
  Filled 2012-08-21: qty 1

## 2012-08-21 NOTE — ED Notes (Signed)
States he had similar situation a month or so ago.  Tonight pain woke him from sleep, much more severe

## 2012-08-21 NOTE — ED Provider Notes (Signed)
History     CSN: [id number]  Arrival date & time 08/21/12  4098   First MD Initiated Contact with Patient 08/21/12 [phone]      Chief Complaint  Patient presents with  . Neck Pain  . Arm Pain    Patient is a 32 y.o. male presenting with neck pain. The history is provided by the patient.  Neck Pain Pain location:  L side Quality:  Aching Pain radiates to:  L arm Pain severity:  Moderate Onset quality:  Gradual Duration:  1 day Timing:  Constant Progression:  Worsening Chronicity:  Recurrent Context comment:  Heavy lifting Worsened by:  Position Associated symptoms: no fever, no headaches, no numbness, no paresis and no weakness   pt reports onset of neck pain yesterday with worsening overnight No trauma/falls  He reports similar pain last month after heavy lifting.   No h/o neck surgery No focal weakness but he reports pain in his left arm No HA No visual changes  Past Medical History  Diagnosis Date  . Hypertension     History reviewed. No pertinent past surgical history.  History reviewed. No pertinent family history.  History  Substance Use Topics  . Smoking status: Former Games developer  . Smokeless tobacco: Not on file  . Alcohol Use: No      Review of Systems  Constitutional: Negative for fever.  HENT: Positive for neck pain. Negative for neck stiffness.   Musculoskeletal: Negative for back pain.  Neurological: Negative for weakness, numbness and headaches.    Allergies  Bee venom and Penicillins  Home Medications   Current Outpatient Rx  Name  Route  Sig  Dispense  Refill  . atenolol (TENORMIN) 25 MG tablet   Oral   Take 0.5 tablets (12.5 mg total) by mouth daily.   14 tablet   0   . baclofen (LIORESAL) 10 MG tablet   Oral   Take 1 tablet (10 mg total) by mouth 3 (three) times daily.   21 each   0   . HYDROcodone-acetaminophen (NORCO/VICODIN) 5-325 MG per tablet   Oral   Take 1 tablet by mouth every 4 (four) hours as needed for pain.   20  tablet   0   . meloxicam (MOBIC) 7.5 MG tablet      1 po bid with food   14 tablet   0     BP 128/76  Pulse 59  Temp(Src) 98 F (36.7 C) (Oral)  Ht 6\' 1"  (1.854 m)  Wt 184 lb (83.462 kg)  BMI 24.28 kg/m2  SpO2 100%  Physical Exam CONSTITUTIONAL: Well developed/well nourished HEAD: Normocephalic/atraumatic EYES: EOMI/PERRL ENMT: Mucous membranes moist, no trismus NECK: supple no meningeal signs, no anterior neck swelling SPINE:entire spine nontender Cervical paraspinal tenderness CV: S1/S2 noted, no murmurs/rubs/gallops noted LUNGS: Lungs are clear to auscultation bilaterally, no apparent distress ABDOMEN: soft, nontender, no rebound or guarding GU:no cva tenderness NEURO: Pt is awake/alert, moves all extremitiesx4 Equal power with hand grip, wrist flex/extension, elbow flex/extension, and equal power with shoulder abduction/adduction.  No focal sensory deficit is noted in either UE.   Equal biceps/brachioradial reflex in bilateral UE EXTREMITIES: pulses normal, full ROM Tenderness to left trapezius noted No warmth/erythema noted to neck or trapezius SKIN: warm, color normal PSYCH: no abnormalities of mood noted  ED Course  Procedures   1. Neck pain     Pt endorses similar episode last month but this time had pain into his arm.  He may have cervical  radiculopathy but currently without focal weakness He is in no distress, no focal neuro deficits I offered xray imaging but he deferred and will f/u He has all of his meds from previous visit and does not need any new meds    MDM  Nursing notes including past medical history and social history reviewed and considered in documentation Previous records reviewed and considered         Joya Gaskins, MD 08/21/12 [phone]

## 2012-08-21 NOTE — ED Notes (Addendum)
Pt reporting pain in left side of neck, starting yesterday.  States that he took a nap and and when he woke up, the left arm was also hurting.  Pt denies taking any OTC medications.

## 2012-12-27 ENCOUNTER — Encounter (HOSPITAL_COMMUNITY): Payer: Self-pay | Admitting: Emergency Medicine

## 2012-12-27 ENCOUNTER — Emergency Department (HOSPITAL_COMMUNITY)
Admission: EM | Admit: 2012-12-27 | Discharge: 2012-12-27 | Disposition: A | Discharge status: Home / Self Care | Payer: BC Managed Care – PPO | Attending: Emergency Medicine | Admitting: Emergency Medicine

## 2012-12-27 DIAGNOSIS — M549 Dorsalgia, unspecified: Secondary | ICD-10-CM | POA: Insufficient documentation

## 2012-12-27 DIAGNOSIS — Z87891 Personal history of nicotine dependence: Secondary | ICD-10-CM | POA: Insufficient documentation

## 2012-12-27 DIAGNOSIS — R1032 Left lower quadrant pain: Secondary | ICD-10-CM | POA: Insufficient documentation

## 2012-12-27 DIAGNOSIS — R109 Unspecified abdominal pain: Secondary | ICD-10-CM

## 2012-12-27 DIAGNOSIS — Z880 Allergy status to penicillin: Secondary | ICD-10-CM | POA: Insufficient documentation

## 2012-12-27 DIAGNOSIS — I10 Essential (primary) hypertension: Secondary | ICD-10-CM | POA: Insufficient documentation

## 2012-12-27 DIAGNOSIS — Z79899 Other long term (current) drug therapy: Secondary | ICD-10-CM | POA: Insufficient documentation

## 2012-12-27 DIAGNOSIS — I498 Other specified cardiac arrhythmias: Secondary | ICD-10-CM | POA: Insufficient documentation

## 2012-12-27 LAB — URINALYSIS, ROUTINE W REFLEX MICROSCOPIC
Bilirubin Urine: NEGATIVE
Ketones, ur: NEGATIVE mg/dL
Nitrite: NEGATIVE
Urobilinogen, UA: 0.2 mg/dL (ref 0.0–1.0)

## 2012-12-27 MED ORDER — POLYETHYLENE GLYCOL 3350 17 G PO PACK: 17.0000 g | PACK | Freq: Every day | ORAL | Status: DC

## 2012-12-27 NOTE — ED Notes (Signed)
Pt presents to department for evaluation of L sided abdominal pain radiating to back. Ongoing x1 month. 6/10, describes as constant pain. Also states cloudy urine. No nausea/vomiting. Denies fever. He is alert and oriented x4.

## 2012-12-27 NOTE — ED Notes (Signed)
Pt comfortable with d/c and f/u instructions. Prescriptions x1 

## 2012-12-27 NOTE — ED Provider Notes (Signed)
CSN: [id number]     Arrival date & time 12/27/12  1825 History   First MD Initiated Contact with Patient 12/27/12 2036     Chief Complaint  Patient presents with  . Abdominal Pain  . Back Pain   (Consider location/radiation/quality/duration/timing/severity/associated sxs/prior Treatment) Patient is a 32 y.o. male presenting with abdominal pain and back pain.  Abdominal Pain Back Pain Associated symptoms: abdominal pain    Patient is a 32 yo male with a history of HTN who presents for one month duration of LLQ abdominal pain that has now moved around to his left side and back. Described as "not a painful pain." Feels like a pair of pliers twisting. Notes it has been gradual. Notes that over this time he has had more difficulty with BMs. They have decreased in size and he has had to strain to have a BM. Has noted that he has felt he needed to have a BM but was unable to go on several occasions. Notes his back hurts more with movement. He denies difficulty urinating, dysuria, hematuria, bloody BMs. Denies hernias or testicular pain.  Notes that he was previously on atenolol for HTN and this was stopped because his HR was too low and he was dizzy with this. He denies light headedness, shortness of breath. He notes pain with burping that resolves following burping, though no chest pain. No dyspnea on exertion or exertional chest pain.  Past Medical History  Diagnosis Date  . Hypertension    No past surgical history on file. History reviewed. No pertinent family history. History  Substance Use Topics  . Smoking status: Former Games developer  . Smokeless tobacco: Not on file  . Alcohol Use: No    Review of Systems  Gastrointestinal: Positive for abdominal pain.  Musculoskeletal: Positive for back pain.  otherwise see HPI  Allergies  Bee venom and Penicillins  Home Medications   Current Outpatient Rx  Name  Route  Sig  Dispense  Refill  . atenolol (TENORMIN) 25 MG tablet   Oral   Take  0.5 tablets (12.5 mg total) by mouth daily.   14 tablet   0   . HYDROcodone-acetaminophen (NORCO/VICODIN) 5-325 MG per tablet   Oral   Take 1 tablet by mouth every 4 (four) hours as needed for pain.   20 tablet   0   . meloxicam (MOBIC) 7.5 MG tablet      1 po bid with food   14 tablet   0    BP 124/86  Pulse 47  Temp(Src) 97.7 F (36.5 C) (Oral)  Resp 17  SpO2 100% Physical Exam  Constitutional: He appears well-developed and well-nourished. No distress.  HENT:  Head: Normocephalic and atraumatic.  Mouth/Throat: Oropharynx is clear and moist.  Eyes: Pupils are equal, round, and reactive to light.  Neck: Neck supple.  Cardiovascular: Normal heart sounds.   Bradycardic  Pulmonary/Chest: Effort normal and breath sounds normal.  Abdominal: Soft. Bowel sounds are normal. He exhibits no distension and no mass. There is tenderness (mild in LLQ). There is no rebound and no guarding.  Musculoskeletal: He exhibits no edema.  Minimal tenderness to palpation over left lower back muscles  Neurological: He is alert.  Skin: Skin is warm and dry.    ED Course  Procedures (including critical care time) Labs Review Labs Reviewed  URINALYSIS, ROUTINE W REFLEX MICROSCOPIC   Imaging Review No results found.  EKG Interpretation   None       MDM  1. Abdominal pain    9:00 pm: patient seen and examined. Patient with left lower quadrant abdominal pain for about the past month. Over this time he has had difficulty stooling with small hard pellets and straining. He is minimally tender in the LLQ without guarding and rebound, no masses felt. No fullness. I suspect patient with constipation given history and exam.   Patient also noted to be intermittently bradycardic to 47, though most of the encounter the patient was in the 60's-low 70's. Patient without complain of light headedness, dyspnea, or chest pain on exertion. Suspect this is physiologic bradycardia given that patient is  young and physically active.  Pain is likely related to constipation/gas vs msk pain given more pain in back with movement. Will discharge home with miralax to take twice a day until having regular BMs and will give resource guide for PCP follow-up. Patient advised that he should have follow-up given history of HTN and he voiced understanding. He was given return precautions and advised to follow-up with a PCP.  This patient was discussed and seen with my attending Dr Jodi Mourning.  Marikay Alar, MD [facility] PGY-2 12/27/12 11:28 pm    Glori Luis, MD 12/27/12 2330  Medical screening examination/treatment/procedure(s) were conducted as a shared visit with non-physician practitioner(s) or resident and myself. I personally evaluated the patient during the encounter and agree with the findings and plan unless otherwise indicated.  Well appearing. Intermittent L flank pain for one month, no stone hx. No testical pain. Abd minimal left flank tenderness. No hematuria. Discharge discussed. Likely bowel gas vs msk vs small kidney stone. No fevers.  Flank pain   Enid Skeens, MD 12/28/12 [phone]

## 2013-02-01 ENCOUNTER — Ambulatory Visit: Payer: BC Managed Care – PPO | Attending: Internal Medicine | Admitting: Internal Medicine

## 2013-02-01 VITALS — BP 131/74 | HR 96 | Temp 98.0°F | Resp 18 | Wt 181.8 lb

## 2013-02-01 DIAGNOSIS — M549 Dorsalgia, unspecified: Secondary | ICD-10-CM

## 2013-02-01 DIAGNOSIS — R109 Unspecified abdominal pain: Secondary | ICD-10-CM | POA: Insufficient documentation

## 2013-02-01 MED ORDER — PANTOPRAZOLE SODIUM 40 MG PO TBEC: 40.0000 mg | DELAYED_RELEASE_TABLET | Freq: Every day | ORAL | Status: AC | PRN

## 2013-02-01 NOTE — Patient Instructions (Signed)
Health Maintenance, Males A healthy lifestyle and preventative care can promote health and wellness.  Maintain regular health, dental, and eye exams.  Eat a healthy diet. Foods like vegetables, fruits, whole grains, low-fat dairy products, and lean protein foods contain the nutrients you need without too many calories. Decrease your intake of foods high in solid fats, added sugars, and salt. Get information about a proper diet from your caregiver, if necessary.  Regular physical exercise is one of the most important things you can do for your health. Most adults should get at least 150 minutes of moderate-intensity exercise (any activity that increases your heart rate and causes you to sweat) each week. In addition, most adults need muscle-strengthening exercises on 2 or more days a week.   Maintain a healthy weight. The body mass index (BMI) is a screening tool to identify possible weight problems. It provides an estimate of body fat based on height and weight. Your caregiver can help determine your BMI, and can help you achieve or maintain a healthy weight. For adults 20 years and older:  A BMI below 18.5 is considered underweight.  A BMI of 18.5 to 24.9 is normal.  A BMI of 25 to 29.9 is considered overweight.  A BMI of 30 and above is considered obese.  Maintain normal blood lipids and cholesterol by exercising and minimizing your intake of saturated fat. Eat a balanced diet with plenty of fruits and vegetables. Blood tests for lipids and cholesterol should begin at age 20 and be repeated every 5 years. If your lipid or cholesterol levels are high, you are over 50, or you are a high risk for heart disease, you may need your cholesterol levels checked more frequently.Ongoing high lipid and cholesterol levels should be treated with medicines, if diet and exercise are not effective.  If you smoke, find out from your caregiver how to quit. If you do not use tobacco, do not start.  Lung  cancer screening is recommended for adults aged 55 80 years who are at high risk for developing lung cancer because of a history of smoking. Yearly low-dose computed tomography (CT) is recommended for people who have at least a 30-pack-year history of smoking and are a current smoker or have quit within the past 15 years. A pack year of smoking is smoking an average of 1 pack of cigarettes a day for 1 year (for example: 1 pack a day for 30 years or 2 packs a day for 15 years). Yearly screening should continue until the smoker has stopped smoking for at least 15 years. Yearly screening should also be stopped for people who develop a health problem that would prevent them from having lung cancer treatment.  If you choose to drink alcohol, do not exceed 2 drinks per day. One drink is considered to be 12 ounces (355 mL) of beer, 5 ounces (148 mL) of wine, or 1.5 ounces (44 mL) of liquor.  Avoid use of street drugs. Do not share needles with anyone. Ask for help if you need support or instructions about stopping the use of drugs.  High blood pressure causes heart disease and increases the risk of stroke. Blood pressure should be checked at least every 1 to 2 years. Ongoing high blood pressure should be treated with medicines if weight loss and exercise are not effective.  If you are 45 to 30 years old, ask your caregiver if you should take aspirin to prevent heart disease.  Diabetes screening involves taking a blood   sample to check your fasting blood sugar level. This should be done once every 3 years, after age 45, if you are within normal weight and without risk factors for diabetes. Testing should be considered at a younger age or be carried out more frequently if you are overweight and have at least 1 risk factor for diabetes.  Colorectal cancer can be detected and often prevented. Most routine colorectal cancer screening begins at the age of 50 and continues through age 75. However, your caregiver may  recommend screening at an earlier age if you have risk factors for colon cancer. On a yearly basis, your caregiver may provide home test kits to check for hidden blood in the stool. Use of a small camera at the end of a tube, to directly examine the colon (sigmoidoscopy or colonoscopy), can detect the earliest forms of colorectal cancer. Talk to your caregiver about this at age 50, when routine screening begins. Direct examination of the colon should be repeated every 5 to 10 years through age 75, unless early forms of pre-cancerous polyps or small growths are found.  Hepatitis C blood testing is recommended for all people born from 1945 through 1965 and any individual with known risks for hepatitis C.  Healthy men should no longer receive prostate-specific antigen (PSA) blood tests as part of routine cancer screening. Consult with your caregiver about prostate cancer screening.  Testicular cancer screening is not recommended for adolescents or adult males who have no symptoms. Screening includes self-exam, caregiver exam, and other screening tests. Consult with your caregiver about any symptoms you have or any concerns you have about testicular cancer.  Practice safe sex. Use condoms and avoid high-risk sexual practices to reduce the spread of sexually transmitted infections (STIs).  Use sunscreen. Apply sunscreen liberally and repeatedly throughout the day. You should seek shade when your shadow is shorter than you. Protect yourself by wearing long sleeves, pants, a wide-brimmed hat, and sunglasses year round, whenever you are outdoors.  Notify your caregiver of new moles or changes in moles, especially if there is a change in shape or color. Also notify your caregiver if a mole is larger than the size of a pencil eraser.  A one-time screening for abdominal aortic aneurysm (AAA) and surgical repair of large AAAs by sound wave imaging (ultrasonography) is recommended for ages 65 to 75 years who are  current or former smokers.  Stay current with your immunizations. Document Released: 08/29/2007 Document Revised: 06/27/2012 Document Reviewed: 07/28/2010 [employer] Patient Information 2014 [employer].  

## 2013-02-01 NOTE — Progress Notes (Signed)
Patient here to establish care Complains of lower left side back pain Works out not sure if he pulled a muscle

## 2013-02-01 NOTE — Progress Notes (Signed)
Patient ID: Ryan Rios, male   DOB: 06-22-1980, 32 y.o.   MRN: [id number]  CC: left flank pain  HPI: 32 year old male with no significant past medical history comes to the clinic to establish primary care provider. Patient complains of left flank pain, intermittent, 5/10 in intensity and radiates to the front of the abdomen. This has been going on for past few days. No fevers or chills. No dysuria or hematuria.  Allergies  Allergen Reactions  . Bee Venom Other (See Comments)    unknown  . Penicillins Other (See Comments)    unknown   Past Medical History  Diagnosis Date  . Hypertension    Current Outpatient Prescriptions on File Prior to Visit  Medication Sig Dispense Refill  . [DISCONTINUED] atenolol (TENORMIN) 25 MG tablet Take 0.5 tablets (12.5 mg total) by mouth daily.  14 tablet  0   No current facility-administered medications on file prior to visit.   Family medical history significant for HTN, HLD   History   Social History  . Marital Status: Single    Spouse Name: N/A    Number of Children: N/A  . Years of Education: N/A   Occupational History  . Not on file.   Social History Main Topics  . Smoking status: Former Games developer  . Smokeless tobacco: Not on file  . Alcohol Use: No  . Drug Use: No  . Sexual Activity: Not on file   Other Topics Concern  . Not on file   Social History Narrative  . No narrative on file    Review of Systems  Constitutional: Negative for fever, chills, diaphoresis, activity change, appetite change and fatigue.  HENT: Negative for ear pain, nosebleeds, congestion, facial swelling, rhinorrhea, neck pain, neck stiffness and ear discharge.   Eyes: Negative for pain, discharge, redness, itching and visual disturbance.  Respiratory: Negative for cough, choking, chest tightness, shortness of breath, wheezing and stridor.   Cardiovascular: Negative for chest pain, palpitations and leg swelling.  Gastrointestinal: Negative for abdominal  distention.  Genitourinary: Negative for dysuria, urgency, frequency, hematuria, positive for left flank pain, no decreased urine volume, difficulty urinating and dyspareunia.  Musculoskeletal: Negative for back pain, joint swelling, arthralgias and gait problem. Pain in left flank Neurological: Negative for dizziness, tremors, seizures, syncope, facial asymmetry, speech difficulty, weakness, light-headedness, numbness and headaches.  Hematological: Negative for adenopathy. Does not bruise/bleed easily.  Psychiatric/Behavioral: Negative for hallucinations, behavioral problems, confusion, dysphoric mood, decreased concentration and agitation.    Objective:   Filed Vitals:   02/01/13 1144  BP: 131/74  Pulse: 96  Temp: 98 F (36.7 C)  Resp: 18    Physical Exam  Constitutional: Appears well-developed and well-nourished. No distress.  HENT: Normocephalic. External right and left ear normal. Oropharynx is clear and moist.  Eyes: Conjunctivae and EOM are normal. PERRLA, no scleral icterus.  Neck: Normal ROM. Neck supple. No JVD. No tracheal deviation. No thyromegaly.  CVS: RRR, S1/S2 +, no murmurs, no gallops, no carotid bruit.  Pulmonary: Effort and breath sounds normal, no stridor, rhonchi, wheezes, rales.  Abdominal: Soft. BS +,  no distension, tenderness, rebound or guarding. Left CVA tenderness Musculoskeletal: Normal range of motion. No edema and no tenderness.  Lymphadenopathy: No lymphadenopathy noted, cervical, inguinal. Neuro: Alert. Normal reflexes, muscle tone coordination. No cranial nerve deficit. Skin: Skin is warm and dry. No rash noted. Not diaphoretic. No erythema. No pallor.  Psychiatric: Normal mood and affect. Behavior, judgment, thought content normal.   Lab Results  Component Value Date   WBC 6.0 05/16/2011   HGB 13.9 06/04/2012   HCT 41.0 06/04/2012   MCV 85.1 05/16/2011   PLT 158 05/16/2011   Lab Results  Component Value Date   CREATININE 1.20 06/04/2012   BUN 17  06/04/2012   NA 138 06/04/2012   K 4.1 06/04/2012   CL 103 06/04/2012   CO2 28 05/16/2011    No results found for this basename: HGBA1C   Lipid Panel  No results found for this basename: chol, trig, hdl, cholhdl, vldl, ldlcalc       Assessment and plan:   Left flank pain - obtain renal US - no fever or chills, no hematuria

## 2013-02-03 ENCOUNTER — Other Ambulatory Visit (HOSPITAL_COMMUNITY): Payer: BC Managed Care – PPO

## 2013-02-07 ENCOUNTER — Ambulatory Visit (HOSPITAL_COMMUNITY)
Admission: RE | Admit: 2013-02-07 | Discharge: 2013-02-07 | Disposition: A | Discharge status: Home / Self Care | Payer: BC Managed Care – PPO | Source: Ambulatory Visit | Attending: Internal Medicine | Admitting: Internal Medicine

## 2013-02-07 DIAGNOSIS — M549 Dorsalgia, unspecified: Secondary | ICD-10-CM | POA: Insufficient documentation

## 2013-02-17 IMAGING — CR DG CHEST 2V
2 series · 2 of 2 positions shown · non-contrast
Comparison: Chest radiograph performed 01/15/2011

CLINICAL DATA: Chest discomfort and indigestion for 1 week.

CHEST - 2 VIEW

[view not recorded (1 of 2)]
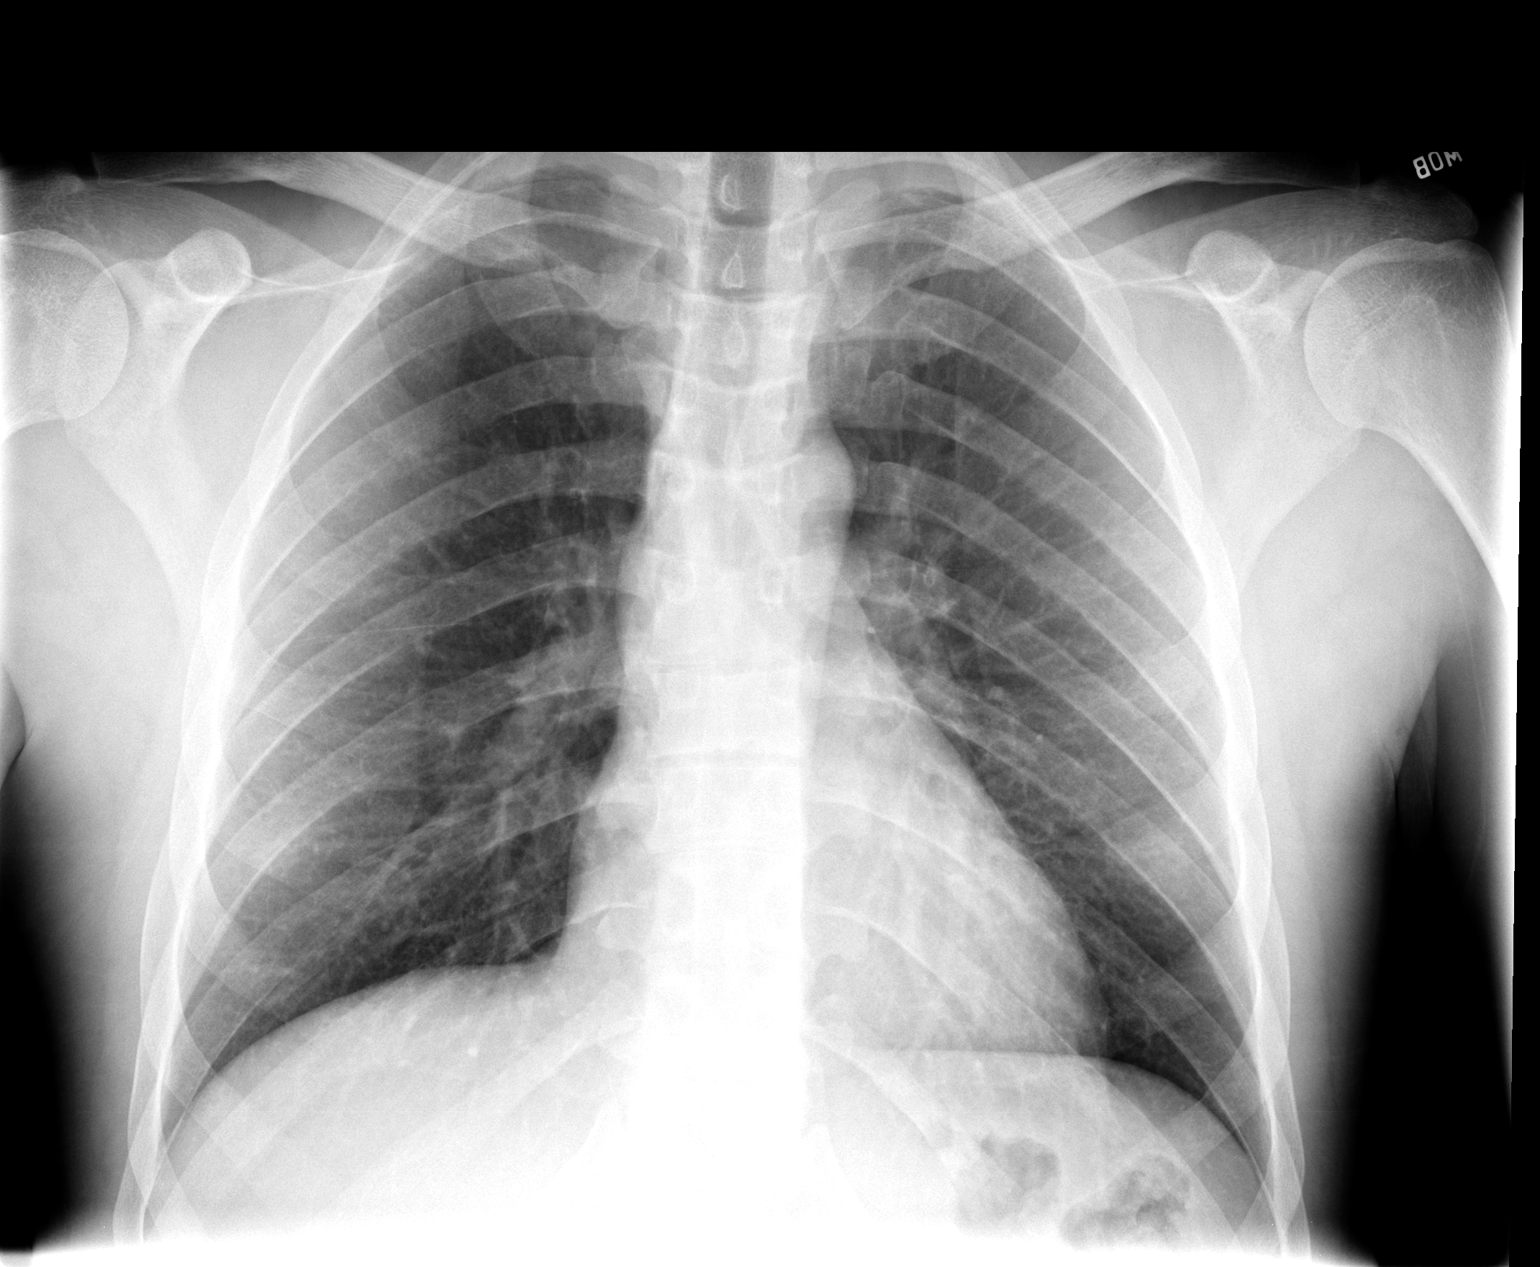

[view not recorded (2 of 2)]
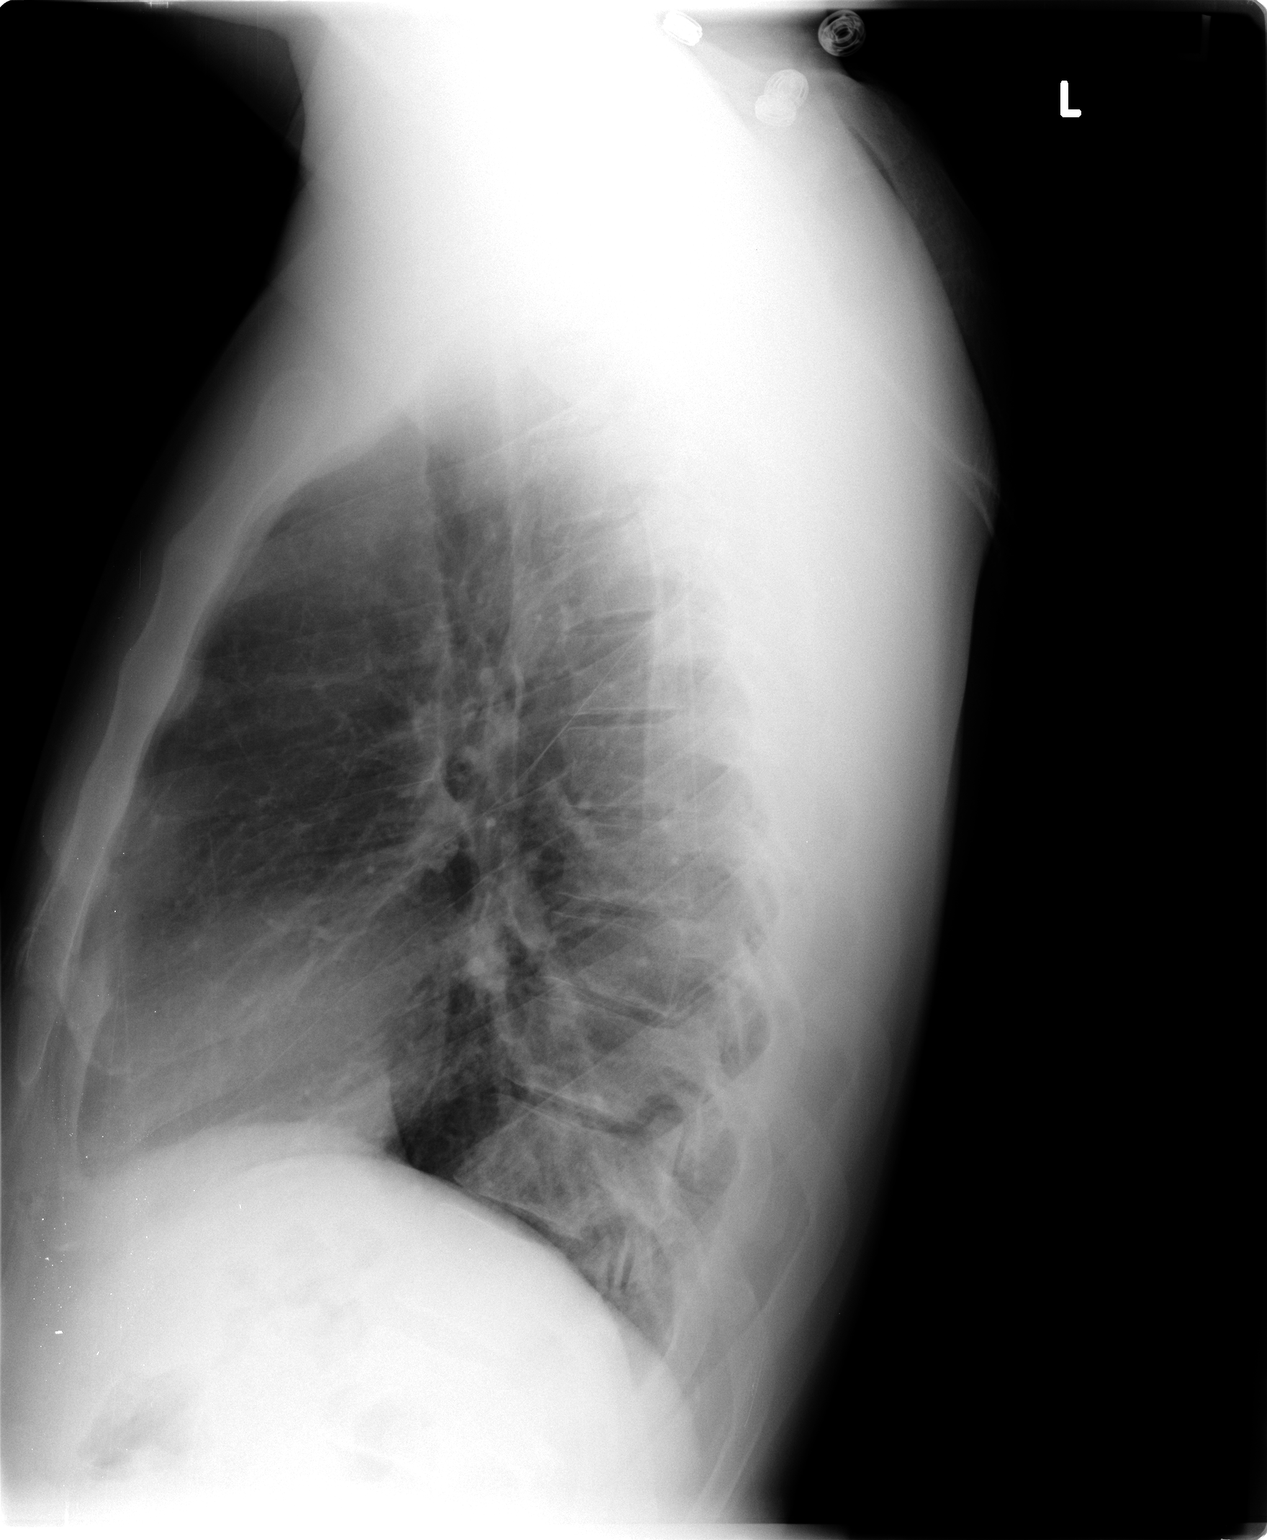

[2 of 2 positions shown; findings below may reference images not displayed]

FINDINGS: The lungs are well-aerated and clear.  There is no
evidence of focal opacification, pleural effusion or pneumothorax.
Bilateral nipple shadows are seen.

The heart is normal in size; the mediastinal contour is within
normal limits.  No acute osseous abnormalities are seen.
IMPRESSION: No acute cardiopulmonary process seen.

## 2014-11-11 IMAGING — US US RENAL
1 series · 14 of 25 positions shown · non-contrast
Comparison: None.

CLINICAL DATA: Evaluate for possible nephrolithiasis

EXAM:
RENAL/URINARY TRACT ULTRASOUND COMPLETE

[Series 1: us renal · 0.21mm/px · 14 of 27 slices shown]
[im 1/27]
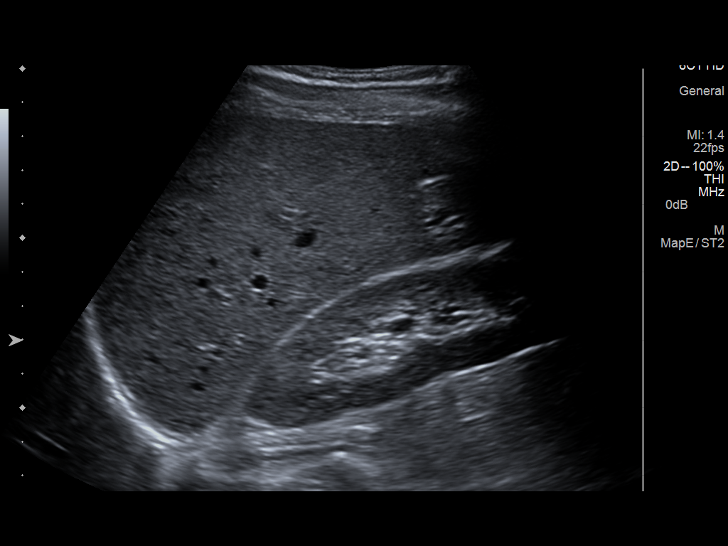
[im 3/27]
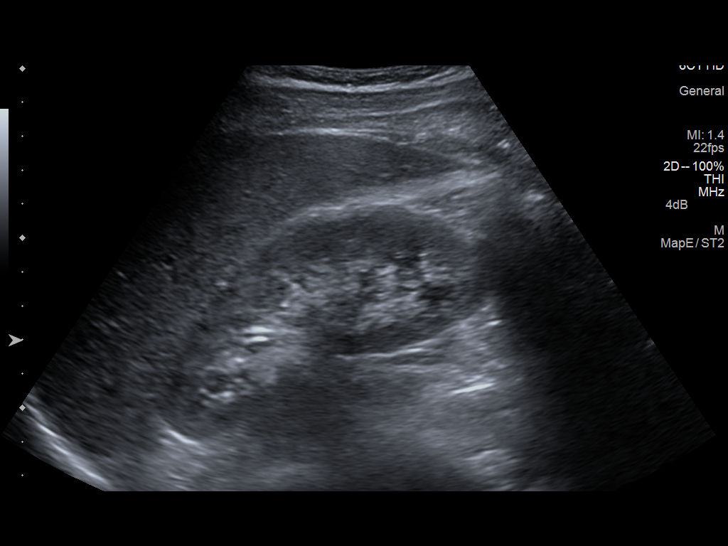
[im 5/27]
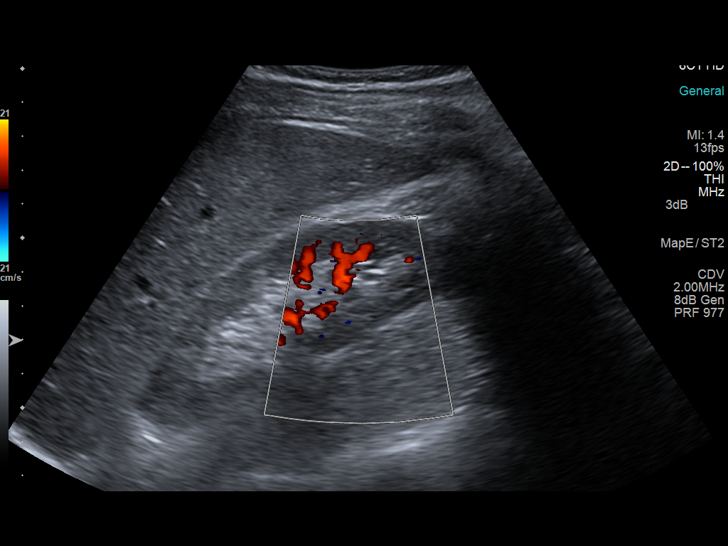
[im 7/27]
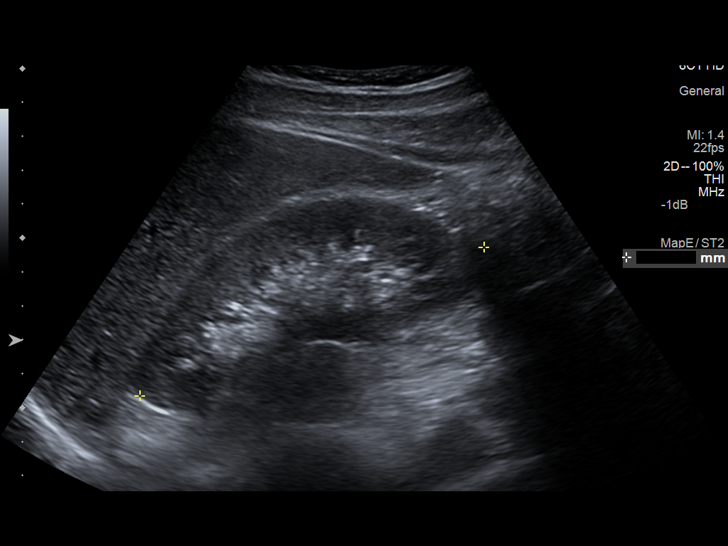
[im 9/27]
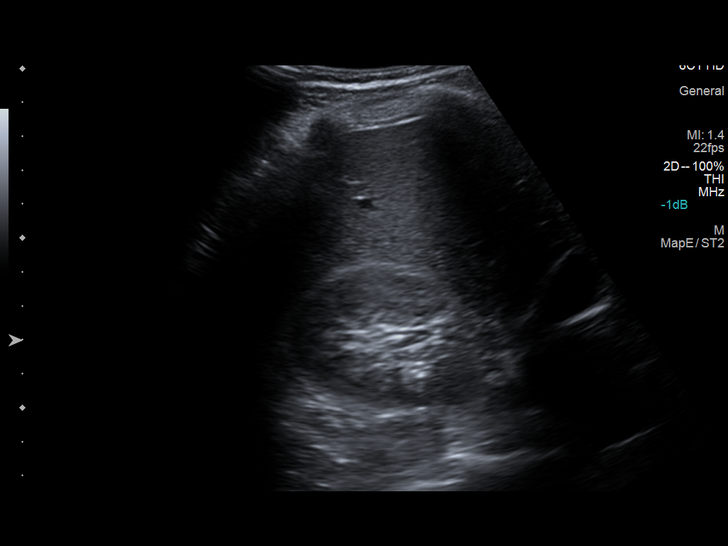
[im 10/27]
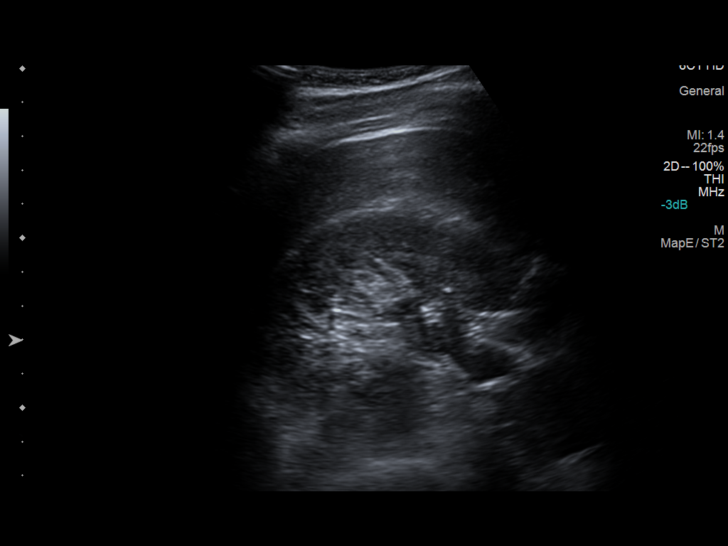
[im 12/27]
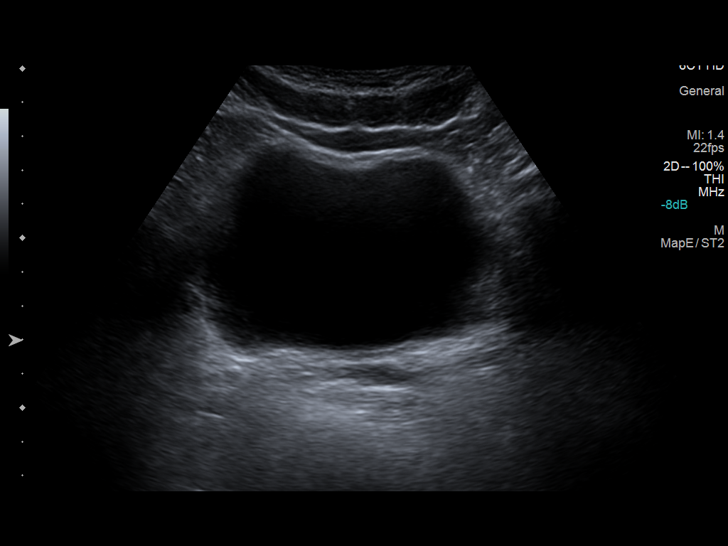
[im 15/27]
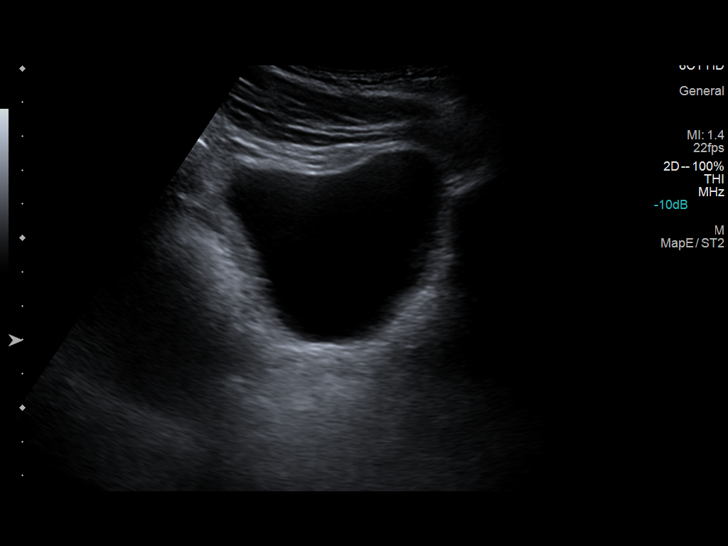
[im 17/27]
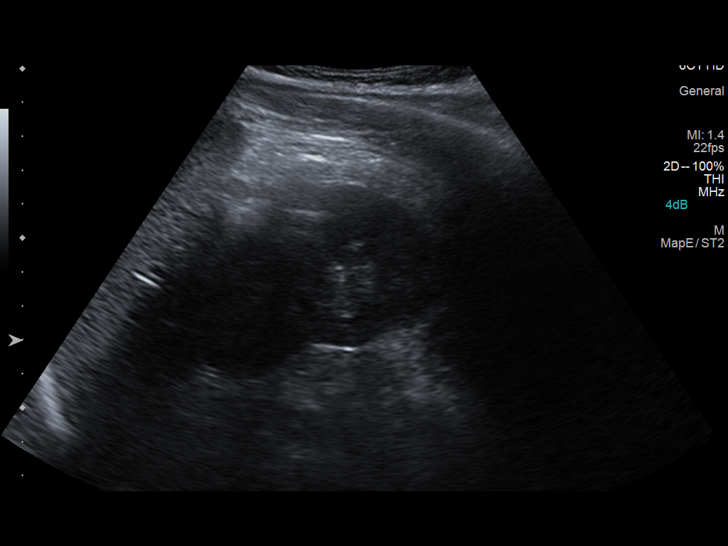
[im 18/27]
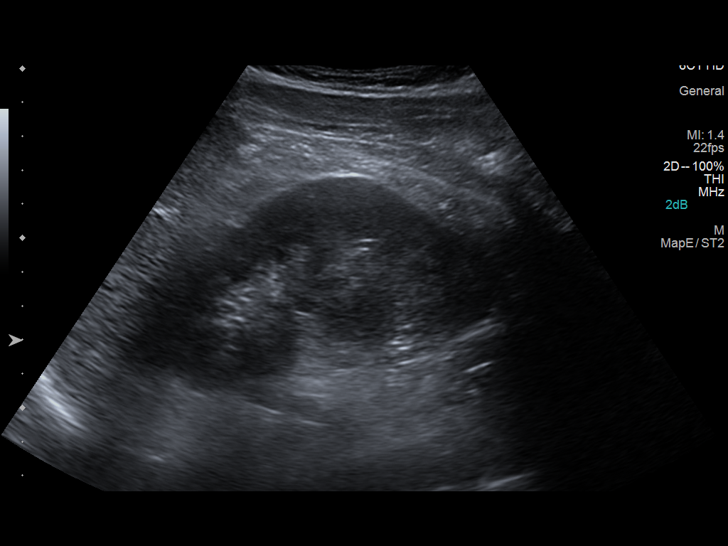
[im 20/27]
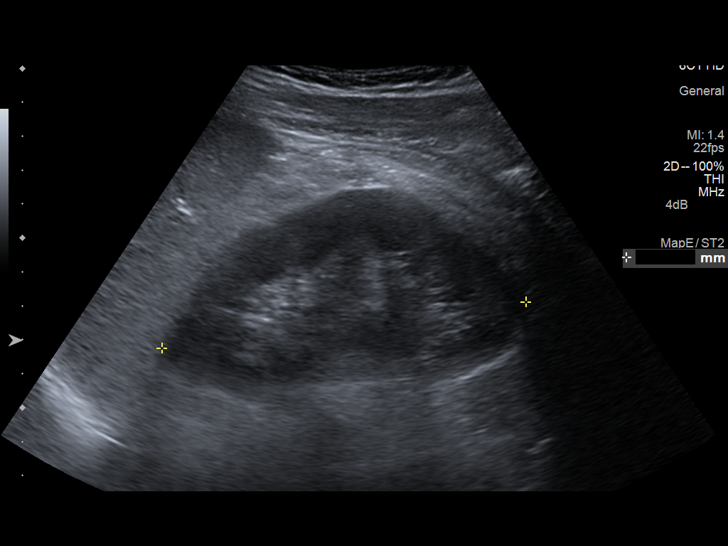
[im 22/27]
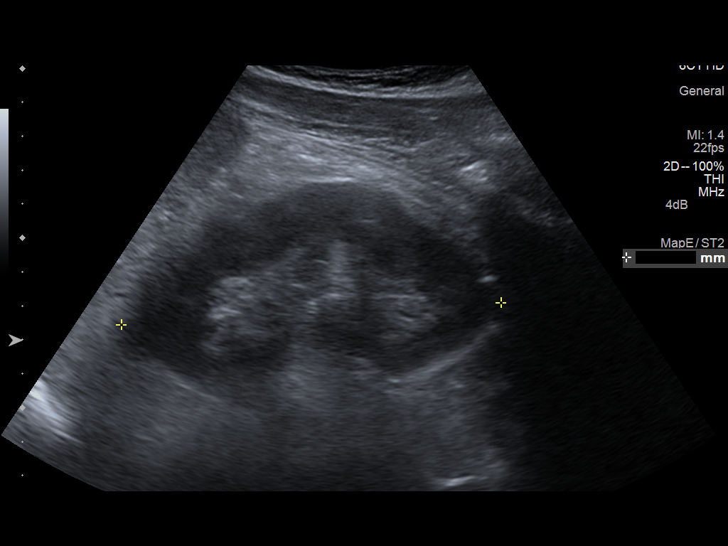
[im 24/27]
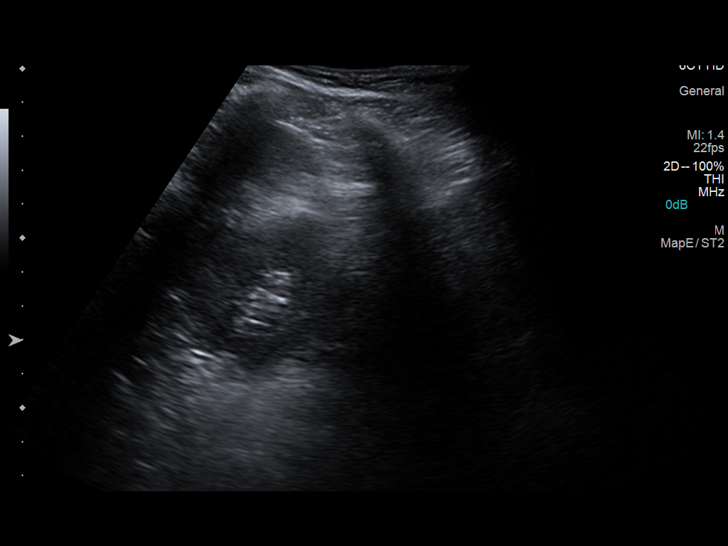
[im 27/27]
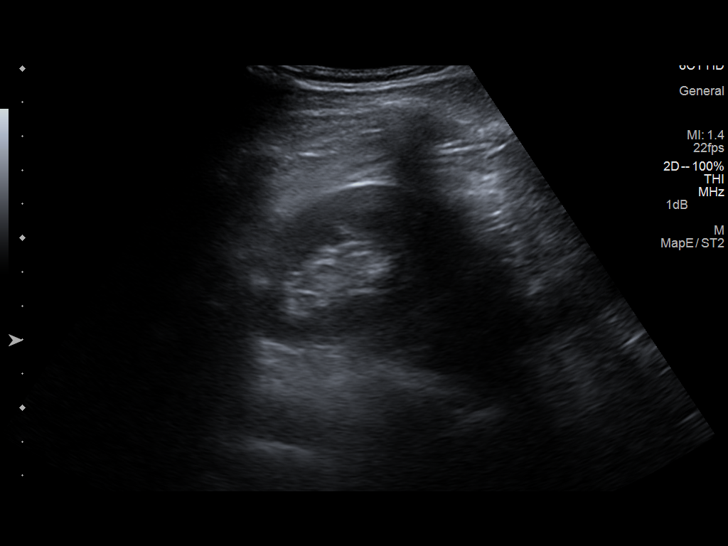

[14 of 25 positions shown; findings below may reference images not displayed]

FINDINGS: Right Kidney

Length: 11 cm. Echogenicity within normal limits. No mass or
hydronephrosis visualized. There is no sonographic evidence of
nephrolithiasis.

Left Kidney

Length: 11.2 cm. Echogenicity within normal limits. No mass or
hydronephrosis visualized. There is no sonographic evidence of
nephrolithiasis.

Bladder

Appears normal for degree of bladder distention.
IMPRESSION: Unremarkable renal ultrasound

## 2016-05-14 ENCOUNTER — Encounter (HOSPITAL_COMMUNITY): Payer: Self-pay | Admitting: *Deleted

## 2016-05-14 ENCOUNTER — Emergency Department (HOSPITAL_COMMUNITY)
Admission: EM | Admit: 2016-05-14 | Discharge: 2016-05-14 | Disposition: A | Discharge status: Home / Self Care | Payer: Self-pay | Attending: Emergency Medicine | Admitting: Emergency Medicine

## 2016-05-14 DIAGNOSIS — Z87891 Personal history of nicotine dependence: Secondary | ICD-10-CM | POA: Insufficient documentation

## 2016-05-14 DIAGNOSIS — J020 Streptococcal pharyngitis: Secondary | ICD-10-CM | POA: Insufficient documentation

## 2016-05-14 DIAGNOSIS — I10 Essential (primary) hypertension: Secondary | ICD-10-CM | POA: Insufficient documentation

## 2016-05-14 LAB — RAPID STREP SCREEN (MED CTR MEBANE ONLY): Streptococcus, Group A Screen (Direct): POSITIVE — AB

## 2016-05-14 MED ORDER — CEPHALEXIN 500 MG PO CAPS: 500.0000 mg | ORAL_CAPSULE | Freq: Four times a day (QID) | ORAL | 0 refills | Status: AC

## 2016-05-14 MED ORDER — IBUPROFEN 800 MG PO TABS
800.0000 mg | ORAL_TABLET | Freq: Once | ORAL | Status: AC
  Administered 2016-05-14: 800 mg via ORAL
  Filled 2016-05-14: qty 1

## 2016-05-14 MED ORDER — CEPHALEXIN 500 MG PO CAPS
500.0000 mg | ORAL_CAPSULE | Freq: Once | ORAL | Status: AC
  Administered 2016-05-14: 500 mg via ORAL
  Filled 2016-05-14: qty 1

## 2016-05-14 MED ORDER — TRAMADOL HCL 50 MG PO TABS: ORAL_TABLET | ORAL | 0 refills | Status: AC

## 2016-05-14 NOTE — Discharge Instructions (Signed)
Your strep test is positive. Your temperature in the emergency department was 100. Please use a mask until symptoms have resolved. Please use Keflex with breakfast, lunch, dinner, and at bedtime until all taken. Use 600 mg of ibuprofen along with the Keflex at each meal and at bedtime. May use Tylenol in between the ibuprofen doses if needed for fever, and or aching. Chloraseptic spray and saltwater gargles may also be helpful. Please wash hands frequently, as this is contagious.

## 2016-05-14 NOTE — ED Provider Notes (Signed)
[facility]EMERGENCY DEPT Provider Note   CSN: [id number] Arrival date & time: 05/14/16  1256     History   Chief Complaint Chief Complaint  Patient presents with  . Fever    HPI Ryan Rios is a 36 y.o. male.   Sore Throat  This is a new problem. The current episode started 3 to 5 hours ago. The problem occurs constantly. The problem has not changed since onset.Pertinent negatives include no headaches and no shortness of breath. Nothing aggravates the symptoms. Nothing relieves the symptoms. He has tried nothing for the symptoms.    Past Medical History:  Diagnosis Date  . Hypertension     Patient Active Problem List   Diagnosis Date Noted  . Left flank pain 02/01/2013    History reviewed. No pertinent surgical history.     Home Medications    Prior to Admission medications   Medication Sig Start Date End Date Taking? Authorizing Provider  pantoprazole (PROTONIX) 40 MG tablet Take 1 tablet (40 mg total) by mouth daily as needed. 02/01/13   Alison Murray, MD    Family History No family history on file.  Social History Social History  Substance Use Topics  . Smoking status: Former Games developer  . Smokeless tobacco: Never Used  . Alcohol use No     Allergies   Penicillins   Review of Systems Review of Systems  Constitutional: Positive for chills and fever.  HENT: Positive for sore throat. Negative for congestion, ear pain, sinus pain and trouble swallowing.   Respiratory: Negative for shortness of breath and wheezing.   Neurological: Negative for headaches.  All other systems reviewed and are negative.    Physical Exam Updated Vital Signs BP 141/85   Pulse 96   Temp 100 F (37.8 C) (Oral)   Resp 17   Ht 6\' 1"  (1.854 m)   Wt 79.4 kg   SpO2 100%   BMI 23.09 kg/m   Physical Exam  HENT:  Mouth/Throat: Posterior oropharyngeal erythema present.  Neck: Trachea normal and normal range of motion. Neck supple. No neck rigidity.  Cervical lymph nodes  palpated.     ED Treatments / Results  Labs (all labs ordered are listed, but only abnormal results are displayed) Labs Reviewed  RAPID STREP SCREEN (NOT AT [facility])    EKG  EKG Interpretation None       Radiology No results found.  Procedures Procedures (including critical care time)  Medications Ordered in ED Medications - No data to display   Initial Impression / Assessment and Plan / ED Course  I have reviewed the triage vital signs and the nursing notes.  Pertinent labs & imaging results that were available during my care of the patient were reviewed by me and considered in my medical decision making (see chart for details).     **I have reviewed nursing notes, vital signs, and all appropriate lab and imaging results for this patient.*  Final Clinical Impressions(s) / ED Diagnoses MDM Strep test is positive on. Patient will be treated with Keflex, salt water gargles, Chloraseptic, and Ultram. ([place] data base checked)The patient will use 600 mg of ibuprofen with breakfast, lunch, dinner, and at bedtime. I've instructed the patient on the importance of good handwashing and to use a mask until symptoms have resolved. Patient is in agreement with this plan.    Final diagnoses:  Strep pharyngitis    New Prescriptions Discharge Medication List as of 05/14/2016  3:04 PM    START  taking these medications   Details  cephALEXin (KEFLEX) 500 MG capsule Take 1 capsule (500 mg total) by mouth 4 (four) times daily., Starting Thu 05/14/2016, Print    traMADol (ULTRAM) 50 MG tablet 1 or 2 po q6h prn pain, Print         Ivery QualeHobson Ela Moffat, PA-C 05/15/16 1209    Cathren LaineKevin Steinl, MD 05/15/16 1209

## 2016-05-14 NOTE — ED Triage Notes (Signed)
Pt c/o fever, sore throat and weakness that started approximately 3 hours ago. Pt denies cough, nasal congestion.

## 2018-12-27 ENCOUNTER — Other Ambulatory Visit: Payer: Self-pay

## 2018-12-27 DIAGNOSIS — Z20822 Contact with and (suspected) exposure to covid-19: Secondary | ICD-10-CM

## 2018-12-29 LAB — NOVEL CORONAVIRUS, NAA: SARS-CoV-2, NAA: NOT DETECTED

## 2018-12-30 ENCOUNTER — Telehealth: Payer: Self-pay | Admitting: Hematology

## 2018-12-30 NOTE — Telephone Encounter (Signed)
Pt is aware covid 19 test is negative °
